# Patient Record
Sex: Female | Born: 1970 | Race: White | Hispanic: No | Marital: Married | Smoking: Former smoker
Health system: Southern US, Community
[De-identification: ages and names within clinical notes are randomized; demographics above are authoritative.]

## PROBLEM LIST (undated history)

## (undated) DIAGNOSIS — I10 Essential (primary) hypertension: Secondary | ICD-10-CM

## (undated) DIAGNOSIS — Z8742 Personal history of other diseases of the female genital tract: Secondary | ICD-10-CM

## (undated) DIAGNOSIS — F419 Anxiety disorder, unspecified: Secondary | ICD-10-CM

## (undated) HISTORY — DX: Essential (primary) hypertension: I10

## (undated) HISTORY — DX: Anxiety disorder, unspecified: F41.9

## (undated) HISTORY — DX: Personal history of other diseases of the female genital tract: Z87.42

---

## 1998-11-16 ENCOUNTER — Emergency Department (HOSPITAL_COMMUNITY): Admission: EM | Admit: 1998-11-16 | Discharge: 1998-11-16 | Payer: Self-pay | Admitting: Emergency Medicine

## 2001-12-28 ENCOUNTER — Other Ambulatory Visit: Admission: RE | Admit: 2001-12-28 | Discharge: 2001-12-28 | Payer: Self-pay | Admitting: Obstetrics and Gynecology

## 2002-09-14 ENCOUNTER — Encounter: Admission: RE | Admit: 2002-09-14 | Discharge: 2002-09-14 | Payer: Self-pay | Admitting: Family Medicine

## 2002-09-14 ENCOUNTER — Encounter: Payer: Self-pay | Admitting: Family Medicine

## 2007-07-07 ENCOUNTER — Ambulatory Visit: Payer: Self-pay | Admitting: Family Medicine

## 2010-02-02 ENCOUNTER — Ambulatory Visit (HOSPITAL_COMMUNITY): Admission: RE | Admit: 2010-02-02 | Discharge: 2010-02-02 | Payer: Self-pay | Admitting: Gastroenterology

## 2010-02-19 ENCOUNTER — Encounter: Admission: RE | Admit: 2010-02-19 | Discharge: 2010-02-19 | Payer: Self-pay | Admitting: *Deleted

## 2010-02-27 HISTORY — PX: CHOLECYSTECTOMY: SHX55

## 2010-09-29 ENCOUNTER — Ambulatory Visit: Payer: Self-pay | Admitting: Family Medicine

## 2010-09-29 DIAGNOSIS — R350 Frequency of micturition: Secondary | ICD-10-CM | POA: Insufficient documentation

## 2010-09-29 DIAGNOSIS — R5383 Other fatigue: Secondary | ICD-10-CM | POA: Insufficient documentation

## 2010-09-29 DIAGNOSIS — R5381 Other malaise: Secondary | ICD-10-CM

## 2010-09-29 LAB — CONVERTED CEMR LAB
ALT: 43 units/L — ABNORMAL HIGH (ref 0–35)
AST: 37 units/L (ref 0–37)
Albumin: 3.8 g/dL (ref 3.5–5.2)
Alkaline Phosphatase: 120 units/L — ABNORMAL HIGH (ref 39–117)
BUN: 14 mg/dL (ref 6–23)
Basophils Absolute: 0 10*3/uL (ref 0.0–0.1)
Basophils Relative: 0.5 % (ref 0.0–3.0)
Bilirubin, Direct: 0 mg/dL (ref 0.0–0.3)
CO2: 29 meq/L (ref 19–32)
Calcium: 9.2 mg/dL (ref 8.4–10.5)
Chloride: 106 meq/L (ref 96–112)
Creatinine, Ser: 0.9 mg/dL (ref 0.4–1.2)
Eosinophils Absolute: 0.1 10*3/uL (ref 0.0–0.7)
Eosinophils Relative: 2.1 % (ref 0.0–5.0)
Folate: 9.2 ng/mL
Free T4: 0.87 ng/dL (ref 0.60–1.60)
GFR calc non Af Amer: 77.63 mL/min (ref >60)
Glucose, Bld: 69 mg/dL — ABNORMAL LOW (ref 70–99)
HCT: 36 % (ref 36.0–46.0)
Hemoglobin: 12.4 g/dL (ref 12.0–15.0)
Hgb A1c MFr Bld: 5.6 % (ref 4.6–6.5)
Iron: 45 ug/dL (ref 42–145)
Lymphocytes Relative: 30.7 % (ref 12.0–46.0)
Lymphs Abs: 2 10*3/uL (ref 0.7–4.0)
MCHC: 34.5 g/dL (ref 30.0–36.0)
MCV: 83.8 fL (ref 78.0–100.0)
Monocytes Absolute: 0.4 10*3/uL (ref 0.1–1.0)
Monocytes Relative: 6.6 % (ref 3.0–12.0)
Neutro Abs: 4 10*3/uL (ref 1.4–7.7)
Neutrophils Relative %: 60.1 % (ref 43.0–77.0)
Platelets: 276 10*3/uL (ref 150.0–400.0)
Potassium: 4.2 meq/L (ref 3.5–5.1)
RBC: 4.29 M/uL (ref 3.87–5.11)
RDW: 13.6 % (ref 11.5–14.6)
Saturation Ratios: 13.7 % — ABNORMAL LOW (ref 20.0–50.0)
Sodium: 142 meq/L (ref 135–145)
T3, Free: 3.2 pg/mL (ref 2.3–4.2)
TSH: 2.39 microintl units/mL (ref 0.35–5.50)
Total Bilirubin: 0.5 mg/dL (ref 0.3–1.2)
Total Protein: 6.8 g/dL (ref 6.0–8.3)
Transferrin: 234.7 mg/dL (ref 212.0–360.0)
Vitamin B-12: 887 pg/mL (ref 211–911)
WBC: 6.6 10*3/uL (ref 4.5–10.5)

## 2010-10-06 ENCOUNTER — Ambulatory Visit: Payer: Self-pay | Admitting: Family Medicine

## 2010-10-28 ENCOUNTER — Ambulatory Visit: Payer: Self-pay | Admitting: Family Medicine

## 2010-12-19 ENCOUNTER — Encounter: Payer: Self-pay | Admitting: Family Medicine

## 2010-12-29 ENCOUNTER — Ambulatory Visit
Admission: RE | Admit: 2010-12-29 | Discharge: 2010-12-29 | Payer: Self-pay | Source: Home / Self Care | Attending: Family Medicine | Admitting: Family Medicine

## 2010-12-29 NOTE — Assessment & Plan Note (Signed)
Summary: sinus inf/sore throat/cough/chest congestion/cjr   Vital Signs:  Patient profile:   40 year old female Temp:     98.3 degrees F oral BP sitting:   140 / 86  (left arm) Cuff size:   regular  Vitals Entered By: Sid Falcon LPN (October 28, 2010 2:48 PM)  History of Present Illness: Patient seen with one day history of laryngitis symptoms. Has mild dry cough. Couple day history of postnasal drip symptoms. Clear nasal discharge. Has not tried any medications. Denies any fever. Nonsmoker. Mild malaise. No sore throat.  Allergies (verified): No Known Drug Allergies  Review of Systems  The patient denies fever, hoarseness, chest pain, dyspnea on exertion, prolonged cough, and hemoptysis.    Physical Exam  General:  Well-developed,well-nourished,in no acute distress; alert,appropriate and cooperative throughout examination Ears:  External ear exam shows no significant lesions or deformities.  Otoscopic examination reveals clear canals, tympanic membranes are intact bilaterally without bulging, retraction, inflammation or discharge. Hearing is grossly normal bilaterally. Nose:  External nasal examination shows no deformity or inflammation. Nasal mucosa are pink and moist without lesions or exudates. Mouth:  Oral mucosa and oropharynx without lesions or exudates.  Teeth in good repair. Neck:  No deformities, masses, or tenderness noted. Lungs:  Normal respiratory effort, chest expands symmetrically. Lungs are clear to auscultation, no crackles or wheezes. Heart:  Normal rate and regular rhythm. S1 and S2 normal without gallop, murmur, click, rub or other extra sounds.   Impression & Recommendations:  Problem # 1:  LARYNGITIS, ACUTE (ICD-464.00) differential is viral versus allergic postnasal drip. Samples of Nasonex 2 sprays per nostril once daily and try over-the-counter Allegra or Claritin  Complete Medication List: 1)  Lupron Depot 3.75 Mg Kit (Leuprolide acetate) .... As  directed  Patient Instructions: 1)  Nasonex 2 sprays per nostril once daily 2)  Try over-the-counter Claritin or Allegra   Orders Added: 1)  Est. Patient Level III [04540]

## 2010-12-29 NOTE — Assessment & Plan Note (Signed)
Summary: WEAKNESS, FATIGUE, H/A // RS   Vital Signs:  Patient profile:   40 year old female Height:      60 inches Weight:      194 pounds BMI:     38.02 Temp:     98.5 degrees F oral BP sitting:   130 / 90  (left arm) Cuff size:   regular  Vitals Entered By: Kern Reap CMA Duncan Dull) (September 29, 2010 3:59 PM)  History of Present Illness: [name] is a 40 year old female, married, nonsmoker, who comes in with a 6 week history of fatigue.  No energy.  Her weight is unchanged.  She may have gained a couple pounds.  Review of systems negative except for nocturia x 5, fatigue, and no energy.  No fever, earache, sore throat, cough, nausea, vomiting, diarrhea, etc., are she's currently on Lupron via GYN because of underlying endometriosis.  She does not smoke nor drink.  Family history pertinent mother is diabetic  Psychiatric review of systems negative  Preventive Screening-Counseling & Management  Alcohol-Tobacco     Smoking Status: quit  Hep-HIV-STD-Contraception     Dental Visit-last 6 months no      Drug Use:  no.    Allergies (verified): No Known Drug Allergies  Past History:  Past medical, surgical, family and social histories (including risk factors) reviewed, and no changes noted (except as noted below).  Past Surgical History: Cholecystectomy  Family History: Reviewed history and no changes required. Father: HTN, fibermyalgia, arthritis Mother: DM II Siblings: none  Social History: Reviewed history and no changes required. Occupation:teacher  Married Alcohol use-no Drug use-no Drug Use:  no Smoking Status:  quit Dental Care w/in 6 mos.:  no  Review of Systems      See HPI  Physical Exam  General:  Well-developed,well-nourished,in no acute distress; alert,appropriate and cooperative throughout examination Head:  Normocephalic and atraumatic without obvious abnormalities. No apparent alopecia or balding. Eyes:  No corneal or conjunctival  inflammation noted. EOMI. Perrla. Funduscopic exam benign, without hemorrhages, exudates or papilledema. Vision grossly normal. Ears:  External ear exam shows no significant lesions or deformities.  Otoscopic examination reveals clear canals, tympanic membranes are intact bilaterally without bulging, retraction, inflammation or discharge. Hearing is grossly normal bilaterally. Nose:  External nasal examination shows no deformity or inflammation. Nasal mucosa are pink and moist without lesions or exudates. Mouth:  Oral mucosa and oropharynx without lesions or exudates.  Teeth in good repair. Neck:  No deformities, masses, or tenderness noted. Chest Wall:  No deformities, masses, or tenderness noted. Lungs:  Normal respiratory effort, chest expands symmetrically. Lungs are clear to auscultation, no crackles or wheezes. Heart:  Normal rate and regular rhythm. S1 and S2 normal without gallop, murmur, click, rub or other extra sounds. Abdomen:  Bowel sounds positive,abdomen soft and non-tender without masses, organomegaly or hernias noted. Msk:  No deformity or scoliosis noted of thoracic or lumbar spine.   Pulses:  R and L carotid,radial,femoral,dorsalis pedis and posterior tibial pulses are full and equal bilaterally Extremities:  No clubbing, cyanosis, edema, or deformity noted with normal full range of motion of all joints.   Neurologic:  No cranial nerve deficits noted. Station and gait are normal. Plantar reflexes are down-going bilaterally. DTRs are symmetrical throughout. Sensory, motor and coordinative functions appear intact. Skin:  Intact without suspicious lesions or rashes Cervical Nodes:  No lymphadenopathy noted Axillary Nodes:  No palpable lymphadenopathy Inguinal Nodes:  No significant adenopathy Psych:  Cognition and judgment appear  intact. Alert and cooperative with normal attention span and concentration. No apparent delusions, illusions, hallucinations   Problems:  Medical  Problems Added: 1)  Dx of Frequency, Urinary  (ICD-788.41) 2)  Dx of Fatigue  (ICD-780.79)  Impression & Recommendations:  Problem # 1:  FREQUENCY, URINARY (ICD-788.41) Assessment New  Orders: Venipuncture (29528) TLB-BMP (Basic Metabolic Panel-BMET) (80048-METABOL) TLB-CBC Platelet - w/Differential (85025-CBCD) TLB-Hepatic/Liver Function Pnl (80076-HEPATIC) TLB-TSH (Thyroid Stimulating Hormone) (84443-TSH) TLB-B12 + Folate Pnl (41324_40102-V25/DGU) TLB-IBC Pnl (Iron/FE;Transferrin) (83550-IBC) TLB-T3, Free (Triiodothyronine) (84481-T3FREE) TLB-T4 (Thyrox), Free (84439-FT4R) TLB-A1C / Hgb A1C (Glycohemoglobin) (83036-A1C) UA Dipstick w/o Micro (automated)  (81003) Specimen Handling (44034)  Problem # 2:  FATIGUE (ICD-780.79) Assessment: New  Orders: Venipuncture (74259) TLB-BMP (Basic Metabolic Panel-BMET) (80048-METABOL) TLB-CBC Platelet - w/Differential (85025-CBCD) TLB-Hepatic/Liver Function Pnl (80076-HEPATIC) TLB-TSH (Thyroid Stimulating Hormone) (84443-TSH) TLB-B12 + Folate Pnl (56387_56433-I95/JOA) TLB-IBC Pnl (Iron/FE;Transferrin) (83550-IBC) TLB-T3, Free (Triiodothyronine) (84481-T3FREE) TLB-T4 (Thyrox), Free (84439-FT4R) TLB-A1C / Hgb A1C (Glycohemoglobin) (83036-A1C) UA Dipstick w/o Micro (automated)  (81003) Specimen Handling (41660)  Complete Medication List: 1)  Lupron Depot 3.75 Mg Kit (Leuprolide acetate) .... As directed  Patient Instructions: 1)  I will call you when I get your lab work back to discuss the various options   Orders Added: 1)  Venipuncture [36415] 2)  TLB-BMP (Basic Metabolic Panel-BMET) [80048-METABOL] 3)  TLB-CBC Platelet - w/Differential [85025-CBCD] 4)  TLB-Hepatic/Liver Function Pnl [80076-HEPATIC] 5)  TLB-TSH (Thyroid Stimulating Hormone) [84443-TSH] 6)  TLB-B12 + Folate Pnl [82746_82607-B12/FOL] 7)  TLB-IBC Pnl (Iron/FE;Transferrin) [83550-IBC] 8)  TLB-T3, Free (Triiodothyronine) [84481-T3FREE] 9)  TLB-T4 (Thyrox), Free  [63016-WF0X] 10)  TLB-A1C / Hgb A1C (Glycohemoglobin) [83036-A1C] 11)  Est. Patient Level IV [32355] 12)  UA Dipstick w/o Micro (automated)  [81003] 13)  Specimen Handling [99000]

## 2010-12-29 NOTE — Assessment & Plan Note (Signed)
Summary: follow up visit - rv lrsc per doc/njr 445p   Vital Signs:  Patient profile:   40 year old female Weight:      191 pounds Temp:     98.1 degrees F oral BP sitting:   154 / 90  (right arm) Cuff size:   regular  Vitals Entered By: Kern Reap CMA Duncan Dull) (October 06, 2010 4:20 PM) CC: follow-up visit   CC:  follow-up visit.  History of Present Illness: [name] is a 40 year old female, who comes in today for reevaluation of fatigue.  We did a complete exam and lab studies.  In reviewing her laboratory data with her everything is normal.  I think the fatigue is probably generated from the Lupron.  She will discuss with her GYN either continue the Lupron or switching to continue his BCPs.  She is also started on an exercise program and has lost 3 pounds.  Encouraged to continue the process  Allergies: No Known Drug Allergies  Past History:  Past medical, surgical, family and social histories (including risk factors) reviewed for relevance to current acute and chronic problems.  Past Surgical History: Reviewed history from 09/29/2010 and no changes required. Cholecystectomy  Family History: Reviewed history from 09/29/2010 and no changes required. Father: HTN, fibermyalgia, arthritis Mother: DM II Siblings: none  Social History: Reviewed history from 09/29/2010 and no changes required. Occupation:teacher  Married Alcohol use-no Drug use-no  Review of Systems      See HPI  Physical Exam  General:  Well-developed,well-nourished,in no acute distress; alert,appropriate and cooperative throughout examination   Impression & Recommendations:  Problem # 1:  FATIGUE (ICD-780.79) Assessment Unchanged  Complete Medication List: 1)  Lupron Depot 3.75 Mg Kit (Leuprolide acetate) .... As directed  Patient Instructions: 1)  [name] GYN because I think the fatigue is a medication related phenomenon.   Orders Added: 1)  Est. Patient Level III [16109]

## 2010-12-29 NOTE — Assessment & Plan Note (Signed)
Summary: roa needs inj for tb/hep A per angela//jnl    Chief Complaint:  update on shots for mission trip.           Patient Instructions: 1)  immunizations were given as outlined by the nurse.  There is no charge for the office visit since it was shots only.          Hepatitis B Vaccine # 1    Vaccine Type: HepB Adult    Site: left deltoid    Mfr: GlaxoSmithKline    Dose: 1.0 ml    Route: IM    Given by: Arcola Jansky, RN    Exp. Date: 04/28/2009    Lot #: [id number]    VIS given: 06/08/00 version given July 07, 2007.  Tetanus/Td Vaccine    Vaccine Type: Tdap    Site: right deltoid    Mfr: sanofi pasteur    Dose: 0.5 ml    Route: IM    Given by: Arcola Jansky, RN    Exp. Date: 09/15/2009    Lot #: [id number]    VIS given: 06/09/05 version given July 07, 2007.  Hepatitis A Vaccine # 1    Vaccine Type: HepA    Site: right deltoid    Mfr: GlaxoSmithKline    Dose: 0.1 ml    Route: IM    Given by: Arcola Jansky, RN    Exp. Date: 08/02/2009    Lot #: [id number]    VIS given: 02/16/05 version given July 07, 2007.  PPD Application    Vaccine Type: PPD    Site: left forearm    Mfr: sanofi  Pasteur    Dose: 0.1 ml    Route: ID    Given by: Arcola Jansky, RN    Exp. Date: 05/20/2009    Lot #: [id number]

## 2010-12-31 ENCOUNTER — Ambulatory Visit (INDEPENDENT_AMBULATORY_CARE_PROVIDER_SITE_OTHER): Payer: Self-pay | Admitting: Family Medicine

## 2010-12-31 DIAGNOSIS — Z0000 Encounter for general adult medical examination without abnormal findings: Secondary | ICD-10-CM

## 2011-01-06 NOTE — Assessment & Plan Note (Signed)
Summary: TB TEST/FORM COMPLETION/NJR   Vital Signs:  Patient profile:   40 year old female Weight:      198 pounds Temp:     98.4 degrees F oral BP sitting:   130 / 94  (left arm) Cuff size:   regular  Vitals Entered By: Kern Reap CMA Duncan Dull) (December 29, 2010 12:50 PM) CC: forms and ppd   CC:  forms and ppd.  Allergies: No Known Drug Allergies   Complete Medication List: 1)  Lupron Depot 3.75 Mg Kit (Leuprolide acetate) .... As directed  Other Orders: TB Skin Test [phone]) Admin 1st Vaccine (69629) No Charge Patient Arrived (NCPA0) (NCPA0)   Orders Added: 1)  TB Skin Test [86580] 2)  Admin 1st Vaccine [90471] 3)  No Charge Patient Arrived (NCPA0) [NCPA0]   Immunizations Administered:  PPD Skin Test:    Vaccine Type: PPD    Site: left forearm    Mfr: Sanofi Pasteur    Dose: 0.1 ml    Route: ID    Given by: Kern Reap CMA (AAMA)    Exp. Date: 10/01/2011    Lot #: [id number]    Physician counseled: yes   Immunizations Administered:  PPD Skin Test:    Vaccine Type: PPD    Site: left forearm    Mfr: Sanofi Pasteur    Dose: 0.1 ml    Route: ID    Given by: Kern Reap CMA (AAMA)    Exp. Date: 10/01/2011    Lot #: [id number]    Physician counseled: yes

## 2011-01-06 NOTE — Letter (Signed)
Summary: Staff Medical Report  Staff Medical Report   Imported By: Maryln Gottron 01/01/2011 13:01:51  _____________________________________________________________________  External Attachment:    Type:   Image     Comment:   External Document

## 2011-01-18 NOTE — Progress Notes (Signed)
  Subjective:    Patient ID: [name], female    DOB: [birth year], 40 y.o.   MRN: [id number]  HPI    Review of Systems     Objective:   Physical Exam        Assessment & Plan:

## 2011-01-22 ENCOUNTER — Ambulatory Visit (INDEPENDENT_AMBULATORY_CARE_PROVIDER_SITE_OTHER): Payer: BC Managed Care – PPO | Admitting: Family Medicine

## 2011-01-22 ENCOUNTER — Encounter: Payer: Self-pay | Admitting: Family Medicine

## 2011-01-22 VITALS — BP 140/90 | HR 107 | Temp 99.5°F

## 2011-01-22 DIAGNOSIS — J029 Acute pharyngitis, unspecified: Secondary | ICD-10-CM

## 2011-01-22 MED ORDER — CEPHALEXIN 500 MG PO CAPS: 500.0000 mg | ORAL_CAPSULE | Freq: Three times a day (TID) | ORAL | Status: AC

## 2011-01-22 NOTE — Progress Notes (Signed)
  Subjective:    Patient ID: [name], female    DOB: Mar 06, 1971, 40 y.o.   MRN: [id number]  HPI Here for 3 days of HA. Fever, dry cough, nausea without vomitting, mild abdominal cramps,and a bad ST. Taking Motrin and drinking fluids. Her 59 yr old son was diagnosed with a strep throat 2 days ago.    Review of Systems  Constitutional: Positive for fever.  HENT: Negative for ear pain, congestion and sinus pressure.   Eyes: Negative.   Respiratory: Positive for cough. Negative for chest tightness, shortness of breath and wheezing.   Gastrointestinal: Positive for nausea and abdominal pain. Negative for diarrhea.       Objective:   Physical Exam  Constitutional: She appears well-developed and well-nourished. No distress.  HENT:  Head: Normocephalic and atraumatic.  Right Ear: External ear normal.  Left Ear: External ear normal.  Nose: Nose normal.  Mouth/Throat: No oropharyngeal exudate.       Posterior OP is red   Eyes: Conjunctivae and EOM are normal. Pupils are equal, round, and reactive to light.  Pulmonary/Chest: Effort normal and breath sounds normal. No respiratory distress. She has no wheezes. She has no rales. She exhibits no tenderness.  Abdominal: Bowel sounds are normal. She exhibits no distension. There is no tenderness.  Lymphadenopathy:    She has no cervical adenopathy.          Assessment & Plan:  Thi is consistent with a strep infection

## 2011-02-18 ENCOUNTER — Telehealth: Payer: Self-pay | Admitting: *Deleted

## 2011-02-18 NOTE — Telephone Encounter (Signed)
Pt. Is having headaches and blurred vision.  Prefers to go to ER rather than wait until we have an appt.

## 2011-04-28 ENCOUNTER — Other Ambulatory Visit: Payer: Self-pay | Admitting: Obstetrics and Gynecology

## 2011-04-28 ENCOUNTER — Encounter (HOSPITAL_COMMUNITY): Payer: 59

## 2011-04-28 LAB — CBC
HCT: 36.6 % (ref 36.0–46.0)
Platelets: 247 10*3/uL (ref 150–400)
RBC: 4.43 MIL/uL (ref 3.87–5.11)
RDW: 12.7 % (ref 11.5–15.5)
WBC: 7.9 10*3/uL (ref 4.0–10.5)

## 2011-04-28 LAB — SURGICAL PCR SCREEN
MRSA, PCR: NEGATIVE
Staphylococcus aureus: POSITIVE — AB

## 2011-05-05 ENCOUNTER — Ambulatory Visit (HOSPITAL_COMMUNITY)
Admission: RE | Admit: 2011-05-05 | Discharge: 2011-05-06 | Disposition: A | Discharge status: Home / Self Care | Payer: 59 | Source: Ambulatory Visit | Attending: Obstetrics and Gynecology | Admitting: Obstetrics and Gynecology

## 2011-05-05 ENCOUNTER — Other Ambulatory Visit: Payer: Self-pay | Admitting: Obstetrics and Gynecology

## 2011-05-05 DIAGNOSIS — Z01812 Encounter for preprocedural laboratory examination: Secondary | ICD-10-CM | POA: Insufficient documentation

## 2011-05-05 DIAGNOSIS — D252 Subserosal leiomyoma of uterus: Secondary | ICD-10-CM | POA: Insufficient documentation

## 2011-05-05 DIAGNOSIS — Z01818 Encounter for other preprocedural examination: Secondary | ICD-10-CM | POA: Insufficient documentation

## 2011-05-05 DIAGNOSIS — N946 Dysmenorrhea, unspecified: Secondary | ICD-10-CM | POA: Insufficient documentation

## 2011-05-05 DIAGNOSIS — N920 Excessive and frequent menstruation with regular cycle: Secondary | ICD-10-CM | POA: Insufficient documentation

## 2011-05-05 DIAGNOSIS — D251 Intramural leiomyoma of uterus: Secondary | ICD-10-CM | POA: Insufficient documentation

## 2011-05-05 DIAGNOSIS — N815 Vaginal enterocele: Secondary | ICD-10-CM | POA: Insufficient documentation

## 2011-05-05 HISTORY — PX: ABDOMINAL HYSTERECTOMY: SHX81

## 2011-05-05 LAB — HCG, SERUM, QUALITATIVE: Preg, Serum: NEGATIVE

## 2011-05-05 MED ORDER — RHO D IMMUNE GLOBULIN 1500 UNIT/2ML IJ SOLN: 300.0000 ug | Freq: Once | INTRAMUSCULAR | Status: DC

## 2011-05-05 MED ORDER — FENTANYL 10 MCG/ML IV SOLN: INTRAVENOUS | Status: DC | PRN

## 2011-05-05 MED ORDER — ACETAMINOPHEN 325 MG PO TABS: 325.0000 mg | ORAL_TABLET | ORAL | Status: DC | PRN

## 2011-05-05 MED ORDER — CLONIDINE HCL 0.1 MG PO TABS: 0.1000 mg | ORAL_TABLET | ORAL | Status: DC | PRN

## 2011-05-06 LAB — CBC
Hemoglobin: 10.8 g/dL — ABNORMAL LOW (ref 12.0–15.0)
MCH: 28.2 pg (ref 26.0–34.0)
Platelets: 237 10*3/uL (ref 150–400)
RBC: 3.83 MIL/uL — ABNORMAL LOW (ref 3.87–5.11)
WBC: 13.5 10*3/uL — ABNORMAL HIGH (ref 4.0–10.5)

## 2011-06-04 NOTE — Op Note (Signed)
[name], [name]           ACCOUNT NO.:  [id number]  MEDICAL RECORD NO.:  [id number]  LOCATION:  9318                          FACILITY:  [facility]  PHYSICIAN:  Lenoard Aden, M.D.DATE OF BIRTH:  [birth year]  DATE OF PROCEDURE:  05/05/2011 DATE OF DISCHARGE:                              OPERATIVE REPORT   PREOPERATIVE DIAGNOSES:  Symptomatic dysmenorrhea and menorrhagia with a history of endometriosis, responsive to medical therapy, now for definitive therapy.  POSTOPERATIVE DIAGNOSES:  Symptomatic dysmenorrhea and menorrhagia with a history of endometriosis, responsive to medical therapy, now for definitive therapy, symptomatic fibroids and enterocele.  PROCEDURES:  Da Vinci-assisted total laparoscopic hysterectomy, Rogelio Seen culdoplasty.  SURGEON:  Lenoard Aden, MD  ASSISTANT:  Fredric Mare.  ANESTHESIA:  General.  ESTIMATED BLOOD LOSS:  Less than 100 mL.  COMPLICATIONS:  None.  DRAINS:  Foley.  COUNTS:  Correct.  The patient recovered in good condition.  FINDINGS:  Multiple fibroids, otherwise normal uterus, normal tubes, ovaries and a small amount of endometriosis along the posterior uterosacral ligaments.  BRIEF OPERATIVE NOTE:  After being apprised of the risks of anesthesia, infection, bleeding, injury to abdominal organs, need for repair delayed versus immediate complications to include bowel and bladder injury, possible need for repair, the patient was brought to the operating room, she was administered general anesthetic without complications.  She was prepped and draped in sterile fashion.  Foley catheter placed.  Feet were placed in Yellofin stirrups.  A Rumi retractor was placed per vagina in a standard fashion.  Then, attention was turned to the abdominal portion of procedure whereby an infraumbilical incision was made with a scalpel.  Veress needle was placed with opening pressure of -2; 4.2 L of CO2 insufflated without difficulty.  Trocar  placed. Visualization revealed small amount of omental adhesions cephalad to the umbilicus from previous gallbladder surgery.  No evidence of bowel.  No evidence of bowel perforation.  At this time, the accessory sites were marked to 1 accessory port on the right and 2 on the left in the standard fashion.  Three 8-mm trocar sites were placed under direct visualization and robot is docked after achieving deep Trendelenburg position.  Visualization as noted in the operative findings of the case. At this time, the PK was entered through the left port and the EndoShears to the right port arm #3 and attention was turned to the case whereby the ovaries bilaterally appear normal.  Thereby the tubo-ovarian round ligament complexes were clamped bilaterally and ligated and divided using the PK and the EndoShears.  The broad ligament was divided.  The posterior and anterior sheath were developed skeletonizing the uterine vessels bilaterally.  The bladder flap was then developed sharply and palpably the cup was noted after good progression of thebladder flap was achieved.  At this time after achieving skeletonization of bilateral uterine vessels, the ureters having been identified to be well out of the way and peristalsing bilaterally.  The uterine vessels were sealed on the left and then sealed on the right.  After being sealed on the right, they were cut bilaterally and good hemostasis was noted.  The Rumi cup was then scored in 360-degree fashion at cephalad portion of the  cup detaching the specimen which was then retracted in the vagina without difficulty.  Good hemostasis was achieved at the cuff and interrupted sutures of 0 Vicryl on a CT-2 were replaced.  Multiple figure-of-eight, McCall culdoplasty sutures placed.  After closure of the vaginal cuff, good hemostasis was noted.  Irrigation was accomplished.  The patient tolerated the procedure well.  At this junction, the robot was then undocked  and all ports were removed under direct visualization.  CO2 was released.  Specimen was removed from the vagina.  At this time suture ports were closed using 0 Vicryl and 4-0 Vicryl.  Dermabond was placed.  The patient tolerated the procedure well, was awakened and transferred to recovery in good condition.     Lenoard Aden, M.D.     RJT/MEDQ  D:  05/05/2011  T:  05/06/2011  Job:  [id number]  Electronically Signed by Olivia Mackie M.D. on 06/04/2011 07:32:14 AM

## 2011-06-04 NOTE — H&P (Signed)
  NAMEROSELL, KHOURI NO.:  [id number]  MEDICAL RECORD NO.:  [id number]  LOCATION:                                 FACILITY:  PHYSICIAN:  Lenoard Aden, M.D.DATE OF BIRTH:  03-16-1971  DATE OF ADMISSION:  05/05/2011 DATE OF DISCHARGE:                             HISTORY & PHYSICAL   CHIEF COMPLAINT:  Pelvic pain, dysmenorrhea, menorrhagia with a history of endometriosis diagnosed by laparoscopy, now for definitive therapy.  She is a 40 year old white female, G1, P0 with a history of SAB in 1999, with a history of previously diagnosed endometriosis at the time of cholecystectomy, who is now status post long-term treatment with Lupron with marked improvement in her pain and discomfort, who is now after a year of Lupron and now for definitive therapy.  She desires to proceed with hysterectomy at this time with probable ovarian conservation.  She has no known drug allergies.  She has a family history of hypertension, breast cancer, MI, and diabetes.  She is a nonsmoker, nondrinker.  She denies domestic or physical violence.  SURGICAL HISTORY:  Remarkable for cholecystectomy and miscarriage as noted.  Surgical history otherwise noncontributory.  PHYSICAL EXAMINATION:  GENERAL:  She is a well-developed, well-nourished white female in no acute distress. VITAL SIGNS:  Blood pressure 140/88, height of 5 feet and 1-1/2 inch, and weight of 194 pounds. HEENT:  Normal. NECK:  Supple.  Full range of motion. LUNGS:  Clear to auscultation. ABDOMEN:  Soft, nontender. PELVIC:  An anteflexed uterus, not enlarged.  No adnexal masses. EXTREMITIES:  No cords. NEUROLOGIC:  Nonfocal. SKIN:  Intact.  IMPRESSION:  History of symptomatic endometriosis with no response to medical therapy now for definitive surgical therapy.  PLAN:  Proceed with da Vinci-assisted total laparoscopic hysterectomy, possible BSO.  Risks of anesthesia, infection, bleeding, injury  to abdominal organs, need for repair are discussed.  Delayed versus immediate complications include bowel and bladder injury noted, inability or long-term pelvic pain were discussed.  The patient acknowledges and wishes to proceed with definitive therapy.     Lenoard Aden, M.D.     RJT/MEDQ  D:  05/04/2011  T:  05/04/2011  Job:  [id number]  Electronically Signed by Olivia Mackie M.D. on 06/04/2011 07:32:11 AM

## 2011-09-17 ENCOUNTER — Encounter: Payer: Self-pay | Admitting: Family Medicine

## 2011-09-17 ENCOUNTER — Ambulatory Visit (INDEPENDENT_AMBULATORY_CARE_PROVIDER_SITE_OTHER): Payer: 59 | Admitting: Family Medicine

## 2011-09-17 VITALS — BP 130/84 | HR 112 | Temp 98.4°F | Wt 183.0 lb

## 2011-09-17 DIAGNOSIS — J40 Bronchitis, not specified as acute or chronic: Secondary | ICD-10-CM

## 2011-09-17 MED ORDER — AZITHROMYCIN 250 MG PO TABS: ORAL_TABLET | ORAL | Status: AC

## 2011-09-17 NOTE — Progress Notes (Signed)
  Subjective:    Patient ID: [name], female    DOB: 09/18/1971, 40 y.o.   MRN: [id number]  HPI Here for one week of stuffy head, PND, ST, chest congestion, and coughing up green sputum. No fever.    Review of Systems  Constitutional: Negative.   HENT: Positive for congestion, postnasal drip and sinus pressure.   Eyes: Negative.   Respiratory: Positive for cough.        Objective:   Physical Exam  Constitutional: She appears well-developed and well-nourished.  HENT:  Right Ear: External ear normal.  Left Ear: External ear normal.  Nose: Nose normal.  Mouth/Throat: Oropharynx is clear and moist. No oropharyngeal exudate.  Eyes: Conjunctivae are normal. Pupils are equal, round, and reactive to light.  Neck: No thyromegaly present.  Pulmonary/Chest: Effort normal and breath sounds normal. No respiratory distress. She has no wheezes. She has no rales. She exhibits no tenderness.  Lymphadenopathy:    She has no cervical adenopathy.          Assessment & Plan:  Add Delsym prn

## 2012-05-03 ENCOUNTER — Other Ambulatory Visit (HOSPITAL_BASED_OUTPATIENT_CLINIC_OR_DEPARTMENT_OTHER): Payer: Self-pay | Admitting: Obstetrics and Gynecology

## 2012-05-03 DIAGNOSIS — Z1231 Encounter for screening mammogram for malignant neoplasm of breast: Secondary | ICD-10-CM

## 2012-05-08 ENCOUNTER — Ambulatory Visit (HOSPITAL_BASED_OUTPATIENT_CLINIC_OR_DEPARTMENT_OTHER)
Admission: RE | Admit: 2012-05-08 | Discharge: 2012-05-08 | Disposition: A | Discharge status: Home / Self Care | Payer: 59 | Source: Ambulatory Visit | Attending: Obstetrics and Gynecology | Admitting: Obstetrics and Gynecology

## 2012-05-08 ENCOUNTER — Ambulatory Visit (HOSPITAL_BASED_OUTPATIENT_CLINIC_OR_DEPARTMENT_OTHER): Payer: 59

## 2012-05-08 DIAGNOSIS — Z1231 Encounter for screening mammogram for malignant neoplasm of breast: Secondary | ICD-10-CM | POA: Insufficient documentation

## 2012-08-31 ENCOUNTER — Ambulatory Visit (INDEPENDENT_AMBULATORY_CARE_PROVIDER_SITE_OTHER): Payer: 59 | Admitting: Family Medicine

## 2012-08-31 ENCOUNTER — Encounter: Payer: Self-pay | Admitting: Family Medicine

## 2012-08-31 ENCOUNTER — Ambulatory Visit
Admission: RE | Admit: 2012-08-31 | Discharge: 2012-08-31 | Disposition: A | Discharge status: Home / Self Care | Payer: 59 | Source: Ambulatory Visit | Attending: Family Medicine | Admitting: Family Medicine

## 2012-08-31 VITALS — BP 150/100 | HR 92 | Resp 16 | Wt 183.0 lb

## 2012-08-31 DIAGNOSIS — R51 Headache: Secondary | ICD-10-CM

## 2012-08-31 DIAGNOSIS — R42 Dizziness and giddiness: Secondary | ICD-10-CM

## 2012-08-31 DIAGNOSIS — I10 Essential (primary) hypertension: Secondary | ICD-10-CM

## 2012-08-31 DIAGNOSIS — E162 Hypoglycemia, unspecified: Secondary | ICD-10-CM

## 2012-08-31 LAB — CBC WITH DIFFERENTIAL/PLATELET
Basophils Absolute: 0 10*3/uL (ref 0.0–0.1)
Basophils Relative: 0.5 % (ref 0.0–3.0)
Eosinophils Absolute: 0.2 10*3/uL (ref 0.0–0.7)
Eosinophils Relative: 1.9 % (ref 0.0–5.0)
HCT: 40.5 % (ref 36.0–46.0)
Hemoglobin: 13.4 g/dL (ref 12.0–15.0)
Lymphocytes Relative: 21.4 % (ref 12.0–46.0)
MCHC: 33.1 g/dL (ref 30.0–36.0)
MCV: 86.5 fl (ref 78.0–100.0)
Monocytes Relative: 4.5 % (ref 3.0–12.0)
Neutro Abs: 6 10*3/uL (ref 1.4–7.7)
Neutrophils Relative %: 71.7 % (ref 43.0–77.0)
Platelets: 321 10*3/uL (ref 150.0–400.0)
RBC: 4.68 Mil/uL (ref 3.87–5.11)
WBC: 8.3 10*3/uL (ref 4.5–10.5)

## 2012-08-31 LAB — LIPID PANEL
Cholesterol: 146 mg/dL (ref 0–200)
HDL: 51.2 mg/dL (ref >39.00)
LDL Cholesterol: 76 mg/dL (ref 0–99)
Total CHOL/HDL Ratio: 3
Triglycerides: 94 mg/dL (ref 0.0–149.0)
VLDL: 18.8 mg/dL (ref 0.0–40.0)

## 2012-08-31 LAB — HEPATIC FUNCTION PANEL
ALT: 37 U/L — ABNORMAL HIGH (ref 0–35)
AST: 35 U/L (ref 0–37)
Albumin: 4 g/dL (ref 3.5–5.2)
Alkaline Phosphatase: 112 U/L (ref 39–117)
Bilirubin, Direct: 0.1 mg/dL (ref 0.0–0.3)
Total Bilirubin: 0.5 mg/dL (ref 0.3–1.2)
Total Protein: 7.5 g/dL (ref 6.0–8.3)

## 2012-08-31 LAB — GLUCOSE, POCT (MANUAL RESULT ENTRY): POC Glucose: 118 mg/dl — AB (ref 70–99)

## 2012-08-31 LAB — MICROALBUMIN / CREATININE URINE RATIO
Microalb Creat Ratio: 2.6 mg/g (ref 0.0–30.0)
Microalb, Ur: 2.8 mg/dL — ABNORMAL HIGH (ref 0.0–1.9)

## 2012-08-31 LAB — BASIC METABOLIC PANEL
BUN: 9 mg/dL (ref 6–23)
CO2: 27 mEq/L (ref 19–32)
Calcium: 9 mg/dL (ref 8.4–10.5)
Chloride: 105 mEq/L (ref 96–112)
Glucose, Bld: 87 mg/dL (ref 70–99)
Sodium: 139 mEq/L (ref 135–145)

## 2012-08-31 LAB — VITAMIN B12: Vitamin B-12: 615 pg/mL (ref 211–911)

## 2012-08-31 LAB — TSH: TSH: 2.06 u[IU]/mL (ref 0.35–5.50)

## 2012-08-31 MED ORDER — ALPRAZOLAM 0.5 MG PO TABS: ORAL_TABLET | ORAL | Status: DC

## 2012-08-31 MED ORDER — LISINOPRIL-HYDROCHLOROTHIAZIDE 10-12.5 MG PO TABS: 1.0000 | ORAL_TABLET | Freq: Every day | ORAL | Status: DC

## 2012-08-31 NOTE — Progress Notes (Signed)
  Subjective:    [name] is a 41 y.o. female who presents for evaluation of elevated blood pressures. Age at onset of elevated blood pressure:  41. Cardiac symptoms: fatigue and headache, dizziness. Patient denies: chest pain, chest pressure/discomfort, dyspnea, exertional chest pressure/discomfort, irregular heart beat, lower extremity edema, near-syncope, orthopnea, palpitations, paroxysmal nocturnal dyspnea, syncope and tachypnea. Cardiovascular risk factors: obesity (BMI >= 30 kg/m2) and sedentary lifestyle. Use of agents associated with hypertension: none. History of target organ damage: none.  Pt states she had sudden onset of headache this am with dizziness, nausea and she felt liket she was going to pass out.  Her husband picked her up at work and brought her here.  She is new to [place].   The following portions of the patient's history were reviewed and updated as appropriate: allergies, current medications, past family history, past medical history, past social history, past surgical history and problem list.  Review of Systems Pertinent items are noted in HPI.   Objective:    BP 150/100  Pulse 92  Resp 16  Wt 183 lb (83.008 kg)  SpO2 96% General appearance: alert, cooperative, appears stated age and moderate distress Eyes: negative findings: lids and lashes normal, conjunctivae and sclerae normal and pupils equal, round, reactive to light and accomodation Ears: normal TM's and external ear canals both ears Nose: Nares normal. Septum midline. Mucosa normal. No drainage or sinus tenderness. Throat: lips, mucosa, and tongue normal; teeth and gums normal Neck: no adenopathy, supple, symmetrical, trachea midline and thyroid not enlarged, symmetric, no tenderness/mass/nodules Lungs: clear to auscultation bilaterally Heart: S1, S2 normal Extremities: extremities normal, atraumatic, no cyanosis or edema Neurologic: Mental status: Alert, oriented, thought content  appropriate Cranial nerves: normal Sensory: normal Motor: 4/5 strength LLE--but pt states it is not new Coordination: dizziness witn walking--holding on to her husband Gait: Abnormal---just very dizzy  Cardiographics ECG: normal sinus rhythm    Assessment:    Hypertension, stage 2 . Evidence of target organ damage: none.   headache-- sudden onset Dizzy  Plan:    Medication: begin lisinopril hct. Screening labs for initial evaluation: basic metabolic panel, blood sugar, calcium, creatinine, hematocrit, lipid panel, potassium and urinalysis. Dietary sodium restriction. Regular aerobic exercise. Check blood pressures 1 times daily and record. Follow up: 2 weeks and as needed.

## 2012-08-31 NOTE — Patient Instructions (Signed)

## 2012-09-22 ENCOUNTER — Ambulatory Visit: Payer: 59 | Admitting: Family Medicine

## 2012-09-28 ENCOUNTER — Encounter: Payer: Self-pay | Admitting: Family Medicine

## 2012-09-28 ENCOUNTER — Ambulatory Visit (INDEPENDENT_AMBULATORY_CARE_PROVIDER_SITE_OTHER): Payer: 59 | Admitting: Family Medicine

## 2012-09-28 VITALS — BP 136/78 | HR 88 | Temp 99.1°F | Wt 197.4 lb

## 2012-09-28 DIAGNOSIS — I10 Essential (primary) hypertension: Secondary | ICD-10-CM

## 2012-09-28 NOTE — Progress Notes (Signed)
  Subjective:    Patient here for follow-up of elevated blood pressure.  She is not exercising and is adherent to a low-salt diet.  Blood pressure is not checking at home. Cardiac symptoms: none. Patient denies: chest pain, chest pressure/discomfort, claudication, dyspnea, exertional chest pressure/discomfort, irregular heart beat, lower extremity edema, near-syncope, orthopnea, palpitations, paroxysmal nocturnal dyspnea, syncope and tachypnea. Cardiovascular risk factors: hypertension, obesity (BMI >= 30 kg/m2) and sedentary lifestyle. Use of agents associated with hypertension: none. History of target organ damage: none.  The following portions of the patient's history were reviewed and updated as appropriate: allergies, current medications, past family history, past medical history, past social history, past surgical history and problem list.  Review of Systems Pertinent items are noted in HPI.     Objective:    BP 136/78  Pulse 88  Temp 99.1 F (37.3 C) (Oral)  Wt 197 lb 6.4 oz (89.54 kg)  SpO2 98% General appearance: alert, cooperative, appears stated age and no distress Neck: no adenopathy, supple, symmetrical, trachea midline and thyroid not enlarged, symmetric, no tenderness/mass/nodules Lungs: clear to auscultation bilaterally Heart: S1, S2 normal Extremities: extremities normal, atraumatic, no cyanosis or edema    Assessment:    Hypertension, normal blood pressure . Evidence of target organ damage: none.    Plan:    Medication: no change. Dietary sodium restriction. Regular aerobic exercise. Check blood pressures 2-3 times weekly and record. Follow up: 3 months and as needed.

## 2012-09-28 NOTE — Patient Instructions (Signed)

## 2012-10-20 ENCOUNTER — Ambulatory Visit (INDEPENDENT_AMBULATORY_CARE_PROVIDER_SITE_OTHER): Payer: 59 | Admitting: Family

## 2012-10-20 ENCOUNTER — Encounter: Payer: Self-pay | Admitting: Family

## 2012-10-20 VITALS — BP 100/70 | HR 75 | Temp 97.9°F | Resp 16 | Wt 193.1 lb

## 2012-10-20 DIAGNOSIS — J029 Acute pharyngitis, unspecified: Secondary | ICD-10-CM

## 2012-10-20 DIAGNOSIS — J069 Acute upper respiratory infection, unspecified: Secondary | ICD-10-CM

## 2012-10-20 LAB — POCT RAPID STREP A (OFFICE): Rapid Strep A Screen: NEGATIVE

## 2012-10-20 NOTE — Patient Instructions (Addendum)
Viral Infections  A virus is a type of germ. Viruses can cause:   Minor sore throats.   Aches and pains.   Headaches.   Runny nose.   Rashes.   Watery eyes.   Tiredness.   Coughs.   Loss of appetite.   Feeling sick to your stomach (nausea).   Throwing up (vomiting).   Watery poop (diarrhea).  HOME CARE    Only take medicines as told by your doctor.   Drink enough water and fluids to keep your pee (urine) clear or pale yellow. Sports drinks are a good choice.   Get plenty of rest and eat healthy. Soups and broths with crackers or rice are fine.  GET HELP RIGHT AWAY IF:    You have a very bad headache.   You have shortness of breath.   You have chest pain or neck pain.   You have an unusual rash.   You cannot stop throwing up.   You have watery poop that does not stop.   You cannot keep fluids down.   You or your child has a temperature by mouth above 102 F (38.9 C), not controlled by medicine.   Your baby is older than 3 months with a rectal temperature of 102 F (38.9 C) or higher.   Your baby is 3 months old or younger with a rectal temperature of 100.4 F (38 C) or higher.  MAKE SURE YOU:    Understand these instructions.   Will watch this condition.   Will get help right away if you are not doing well or get worse.  Document Released: 10/28/2008 Document Revised: 02/07/2012 Document Reviewed: 03/23/2011  ExitCare Patient Information 2013 [employer].

## 2012-10-20 NOTE — Progress Notes (Signed)
  Subjective:    Patient ID: [name], female    DOB: Apr 24, 1971, 41 y.o.   MRN: [id number]  HPI  [name] is a 41 yr old female who presents today with chief complaint of sore throat.  She reports that her sore throat is currently 6-7/10.  Has associated poor appetite, nausea.  Denies associated fever.  She has used tylenol with some improvement.   Review of Systems See HPI  Past Medical History  Diagnosis Date  . H/O endometritis     History   Social History  . Marital Status: Married    Spouse Name: N/A    Number of Children: N/A  . Years of Education: N/A   Occupational History  . teacher     [employer]   Social History Main Topics  . Smoking status: Former Smoker    Quit date: 11/29/1988  . Smokeless tobacco: Never Used  . Alcohol Use: No  . Drug Use: No  . Sexually Active: Yes -- Female partner(s)   Other Topics Concern  . Not on file   Social History Narrative  . No narrative on file    Past Surgical History  Procedure Date  . Abdominal hysterectomy 05/05/2011    TAH---fibroids and endometriosis  . Cholecystectomy 02/2010    Family History  Problem Relation Age of Onset  . Diabetes Mother   . Kidney disease Mother   . Hypertension Father   . Arthritis Father     rheumatoid  . Heart disease Father   . Cancer Paternal Uncle     liver with mets   . Heart disease Paternal Grandmother   . Cancer Paternal Grandmother     breast    No Known Allergies  Current Outpatient Prescriptions on File Prior to Visit  Medication Sig Dispense Refill  . lisinopril-hydrochlorothiazide (PRINZIDE,ZESTORETIC) 10-12.5 MG per tablet Take 1 tablet by mouth daily.  30 tablet  2  . ALPRAZolam (XANAX) 0.5 MG tablet Take 1 table 30 minutes prior to procedure and bring meds with you to the procedure  10 tablet  0    BP 100/70  Pulse 75  Temp 97.9 F (36.6 C) (Oral)  Resp 16  Wt 193 lb 1.3 oz (87.581 kg)  SpO2 99%       Objective:   Physical Exam   Constitutional: She appears well-developed and well-nourished. No distress.  HENT:  Head: Normocephalic and atraumatic.  Right Ear: Tympanic membrane and ear canal normal.  Left Ear: Tympanic membrane and ear canal normal.       Large tonsils, mild erythema without exudates.   Cardiovascular: Normal rate and regular rhythm.   No murmur heard. Pulmonary/Chest: Effort normal and breath sounds normal. No respiratory distress. She has no wheezes. She has no rales. She exhibits no tenderness.  Psychiatric: She has a normal mood and affect. Her behavior is normal. Thought content normal.          Assessment & Plan:

## 2012-10-20 NOTE — Assessment & Plan Note (Addendum)
Rapid strep negative. Plan supportive measures, call if symptoms worsen or if no improvement in 2-3 days.

## 2012-11-25 ENCOUNTER — Other Ambulatory Visit: Payer: Self-pay | Admitting: Family Medicine

## 2012-12-25 ENCOUNTER — Encounter: Payer: Self-pay | Admitting: Family Medicine

## 2012-12-28 ENCOUNTER — Encounter: Payer: Self-pay | Admitting: Lab

## 2012-12-29 ENCOUNTER — Ambulatory Visit: Payer: 59 | Admitting: Family Medicine

## 2012-12-29 ENCOUNTER — Ambulatory Visit (INDEPENDENT_AMBULATORY_CARE_PROVIDER_SITE_OTHER): Payer: 59 | Admitting: Family Medicine

## 2012-12-29 ENCOUNTER — Encounter: Payer: Self-pay | Admitting: Family Medicine

## 2012-12-29 VITALS — BP 126/80 | HR 88 | Temp 98.3°F | Wt 197.2 lb

## 2012-12-29 DIAGNOSIS — F411 Generalized anxiety disorder: Secondary | ICD-10-CM

## 2012-12-29 DIAGNOSIS — I10 Essential (primary) hypertension: Secondary | ICD-10-CM

## 2012-12-29 DIAGNOSIS — F419 Anxiety disorder, unspecified: Secondary | ICD-10-CM

## 2012-12-29 MED ORDER — ALPRAZOLAM ER 0.5 MG PO TB24: 0.5000 mg | ORAL_TABLET | ORAL | Status: DC

## 2012-12-29 MED ORDER — LISINOPRIL-HYDROCHLOROTHIAZIDE 10-12.5 MG PO TABS: ORAL_TABLET | ORAL | Status: DC

## 2012-12-29 NOTE — Patient Instructions (Addendum)

## 2012-12-29 NOTE — Progress Notes (Signed)
  Subjective:    Patient here for follow-up of elevated blood pressure.  She is exercising and is adherent to a low-salt diet.  Blood pressure is well controlled at home. Cardiac symptoms: none. Patient denies: chest pain, chest pressure/discomfort, claudication, dyspnea, exertional chest pressure/discomfort, fatigue, irregular heart beat, lower extremity edema, near-syncope, orthopnea, palpitations, paroxysmal nocturnal dyspnea, syncope and tachypnea. Cardiovascular risk factors: hypertension and obesity (BMI >= 30 kg/m2). Use of agents associated with hypertension: none. History of target organ damage: none.  The following portions of the patient's history were reviewed and updated as appropriate: allergies, current medications, past family history, past medical history, past social history, past surgical history and problem list.  Review of Systems Pertinent items are noted in HPI.     Objective:    BP 126/80  Pulse 88  Temp 98.3 F (36.8 C) (Oral)  Wt 197 lb 3.2 oz (89.449 kg)  SpO2 98% General appearance: alert, cooperative, appears stated age and no distress Lungs: clear to auscultation bilaterally Heart: S1, S2 normal Extremities: extremities normal, atraumatic, no cyanosis or edema    Assessment:    Hypertension, normal blood pressure . Evidence of target organ damage: none.    Plan:    Medication: no change. Dietary sodium restriction. Regular aerobic exercise. Check blood pressures 2-3 times weekly and record. Follow up: 3 months and as needed.

## 2013-08-31 ENCOUNTER — Telehealth: Payer: Self-pay | Admitting: *Deleted

## 2013-08-31 NOTE — Telephone Encounter (Signed)
Pt states that her blood pressure is still elevated. 169/100-10/2, 168/98-10/01. Pt is currently taking lisinopril-hydrochlorothiazide 10-12.5mg . Pt states that she currently doesn't have any insurance but is willing to get another prescription if she needs to. Please advise. SW

## 2013-08-31 NOTE — Telephone Encounter (Signed)
She has not been seen since January--- she would have to be seen somewhere.  We can help her get on sliding scale with [facility] but she has to be seen

## 2013-08-31 NOTE — Telephone Encounter (Signed)
Called her to inform her of Dr.Lown's recommendations. Pt states she will schedule an appointment.

## 2014-01-25 ENCOUNTER — Ambulatory Visit: Payer: 59 | Admitting: Family Medicine

## 2014-01-25 ENCOUNTER — Encounter: Payer: Self-pay | Admitting: Family Medicine

## 2014-01-25 ENCOUNTER — Ambulatory Visit (INDEPENDENT_AMBULATORY_CARE_PROVIDER_SITE_OTHER): Payer: Self-pay | Admitting: Family Medicine

## 2014-01-25 VITALS — BP 154/92 | HR 76 | Temp 98.5°F | Wt 196.0 lb

## 2014-01-25 DIAGNOSIS — I10 Essential (primary) hypertension: Secondary | ICD-10-CM

## 2014-01-25 DIAGNOSIS — F419 Anxiety disorder, unspecified: Secondary | ICD-10-CM

## 2014-01-25 DIAGNOSIS — F411 Generalized anxiety disorder: Secondary | ICD-10-CM

## 2014-01-25 DIAGNOSIS — R079 Chest pain, unspecified: Secondary | ICD-10-CM

## 2014-01-25 MED ORDER — ALPRAZOLAM ER 0.5 MG PO TB24: 0.5000 mg | ORAL_TABLET | ORAL | Status: DC

## 2014-01-25 MED ORDER — LISINOPRIL-HYDROCHLOROTHIAZIDE 20-25 MG PO TABS: 1.0000 | ORAL_TABLET | Freq: Every day | ORAL | Status: DC

## 2014-01-25 NOTE — Progress Notes (Signed)
Pre visit review using our clinic review tool, if applicable. No additional management support is needed unless otherwise documented below in the visit note. 

## 2014-01-25 NOTE — Progress Notes (Signed)
  Subjective:    Patient here for follow-up of elevated blood pressure.  She is not exercising and is not adherent to a low-salt diet.  Blood pressure is not well controlled at home. Cardiac symptoms: chest pain and dyspnea. Patient denies: claudication, fatigue, irregular heart beat, lower extremity edema, near-syncope, orthopnea, palpitations, paroxysmal nocturnal dyspnea, syncope and tachypnea. Pt was seen in [facility] 01/01/2014 with chest pain radiating down L arm.  Pt was told she needed a stress test.   Cardiovascular risk factors: family history of premature cardiovascular disease, hypertension, obesity (BMI >= 30 kg/m2) and sedentary lifestyle. Use of agents associated with hypertension: none. History of target organ damage: none.  The following portions of the patient's history were reviewed and updated as appropriate:  She  has a past medical history of H/O endometritis. She  does not have any pertinent problems on file. She  has past surgical history that includes Abdominal hysterectomy (05/05/2011) and Cholecystectomy (02/2010). Her family history includes Arthritis in her father; Cancer in her paternal grandmother and paternal uncle; Diabetes in her mother; Heart disease in her father and paternal grandmother; Hypertension in her father; Kidney disease in her mother. She  reports that she quit smoking about 25 years ago. She has never used smokeless tobacco. She reports that she does not drink alcohol or use illicit drugs. She has a current medication list which includes the following prescription(s): alprazolam and lisinopril-hydrochlorothiazide. Current Outpatient Prescriptions on File Prior to Visit  Medication Sig Dispense Refill  . ALPRAZolam (XANAX XR) 0.5 MG 24 hr tablet Take 1 tablet (0.5 mg total) by mouth every morning.  30 tablet  0  . lisinopril-hydrochlorothiazide (PRINZIDE,ZESTORETIC) 10-12.5 MG per tablet 1 po qd  90 tablet  3   No current facility-administered medications on file  prior to visit.   She has No Known Allergies..  Review of Systems Pertinent items are noted in HPI.     Objective:    BP 154/92  Pulse 76  Temp(Src) 98.5 F (36.9 C) (Oral)  Wt 196 lb (88.905 kg)  SpO2 99% General appearance: alert, cooperative, appears stated age and no distress Nose: Nares normal. Septum midline. Mucosa normal. No drainage or sinus tenderness. Throat: lips, mucosa, and tongue normal; teeth and gums normal Neck: no adenopathy, no carotid bruit, no JVD, supple, symmetrical, trachea midline and thyroid not enlarged, symmetric, no tenderness/mass/nodules Lungs: clear to auscultation bilaterally Heart: regular rate and rhythm, S1, S2 normal, no murmur, click, rub or gallop Extremities: extremities normal, atraumatic, no cyanosis or edema    Assessment:    Hypertension, uncontrolled . Evidence of target organ damage: none.    Plan:    Medication: resume lisinopril . Dietary sodium restriction. Regular aerobic exercise. Check blood pressures 2-3 times weekly and record. Follow up: 3 weeks and as needed.

## 2014-01-25 NOTE — Patient Instructions (Signed)

## 2014-01-28 ENCOUNTER — Telehealth: Payer: Self-pay | Admitting: Family Medicine

## 2014-01-28 NOTE — Telephone Encounter (Signed)
Relevant patient education assigned to patient using Emmi. ° °

## 2014-02-08 ENCOUNTER — Encounter: Payer: Self-pay | Admitting: Family Medicine

## 2014-02-08 ENCOUNTER — Ambulatory Visit: Payer: Self-pay | Admitting: Family Medicine

## 2014-02-08 ENCOUNTER — Ambulatory Visit (INDEPENDENT_AMBULATORY_CARE_PROVIDER_SITE_OTHER): Payer: Self-pay | Admitting: Family Medicine

## 2014-02-08 VITALS — BP 126/78 | HR 87 | Temp 98.4°F | Wt 191.0 lb

## 2014-02-08 DIAGNOSIS — F4323 Adjustment disorder with mixed anxiety and depressed mood: Secondary | ICD-10-CM

## 2014-02-08 DIAGNOSIS — I10 Essential (primary) hypertension: Secondary | ICD-10-CM

## 2014-02-08 DIAGNOSIS — R748 Abnormal levels of other serum enzymes: Secondary | ICD-10-CM

## 2014-02-08 DIAGNOSIS — F411 Generalized anxiety disorder: Secondary | ICD-10-CM

## 2014-02-08 LAB — BASIC METABOLIC PANEL
BUN: 18 mg/dL (ref 6–23)
CHLORIDE: 103 meq/L (ref 96–112)
CO2: 23 mEq/L (ref 19–32)
Calcium: 9.3 mg/dL (ref 8.4–10.5)
Creatinine, Ser: 0.9 mg/dL (ref 0.4–1.2)
GFR: 71.55 mL/min (ref >60.00)
Glucose, Bld: 103 mg/dL — ABNORMAL HIGH (ref 70–99)
Potassium: 3.9 mEq/L (ref 3.5–5.1)
Sodium: 136 mEq/L (ref 135–145)

## 2014-02-08 LAB — CBC WITH DIFFERENTIAL/PLATELET
BASOS ABS: 0 10*3/uL (ref 0.0–0.1)
Basophils Relative: 0.4 % (ref 0.0–3.0)
EOS PCT: 2.8 % (ref 0.0–5.0)
Eosinophils Absolute: 0.2 10*3/uL (ref 0.0–0.7)
HCT: 39 % (ref 36.0–46.0)
Hemoglobin: 13 g/dL (ref 12.0–15.0)
LYMPHS ABS: 2.7 10*3/uL (ref 0.7–4.0)
Lymphocytes Relative: 30 % (ref 12.0–46.0)
MCHC: 33.4 g/dL (ref 30.0–36.0)
MCV: 85.7 fl (ref 78.0–100.0)
Monocytes Absolute: 0.5 10*3/uL (ref 0.1–1.0)
Monocytes Relative: 5.1 % (ref 3.0–12.0)
NEUTROS PCT: 61.7 % (ref 43.0–77.0)
Neutro Abs: 5.5 10*3/uL (ref 1.4–7.7)
Platelets: 299 10*3/uL (ref 150.0–400.0)
RBC: 4.55 Mil/uL (ref 3.87–5.11)
RDW: 13 % (ref 11.5–14.6)
WBC: 8.9 10*3/uL (ref 4.5–10.5)

## 2014-02-08 LAB — LIPID PANEL
CHOL/HDL RATIO: 3
Cholesterol: 142 mg/dL (ref 0–200)
HDL: 48.2 mg/dL (ref >39.00)
LDL CALC: 78 mg/dL (ref 0–99)
Triglycerides: 81 mg/dL (ref 0.0–149.0)
VLDL: 16.2 mg/dL (ref 0.0–40.0)

## 2014-02-08 LAB — HEPATIC FUNCTION PANEL
ALT: 21 U/L (ref 0–35)
AST: 20 U/L (ref 0–37)
Albumin: 4.1 g/dL (ref 3.5–5.2)
Alkaline Phosphatase: 121 U/L — ABNORMAL HIGH (ref 39–117)
BILIRUBIN TOTAL: 0.3 mg/dL (ref 0.3–1.2)
Bilirubin, Direct: 0 mg/dL (ref 0.0–0.3)
Total Protein: 7.2 g/dL (ref 6.0–8.3)

## 2014-02-08 NOTE — Progress Notes (Signed)
  Subjective:    Patient here for follow-up of elevated blood pressure.  She is not exercising and is adherent to a low-salt diet.  Blood pressure is well controlled at home. Cardiac symptoms: none. Patient denies: chest pain, chest pressure/discomfort, claudication, dyspnea, exertional chest pressure/discomfort, fatigue, irregular heart beat, lower extremity edema, near-syncope, orthopnea, palpitations, paroxysmal nocturnal dyspnea, syncope and tachypnea. Cardiovascular risk factors: hypertension and sedentary lifestyle. Use of agents associated with hypertension: none. History of target organ damage: none.  The following portions of the patient's history were reviewed and updated as appropriate: allergies, current medications, past family history, past medical history, past social history, past surgical history and problem list.  Review of Systems Pertinent items are noted in HPI.     Objective:    BP 126/78  Pulse 87  Temp(Src) 98.4 F (36.9 C) (Oral)  Wt 191 lb (86.637 kg)  SpO2 100% General appearance: alert, cooperative, appears stated age and no distress Neck: no adenopathy, supple, symmetrical, trachea midline and thyroid not enlarged, symmetric, no tenderness/mass/nodules Lungs: clear to auscultation bilaterally Heart: S1, S2 normal Extremities: extremities normal, atraumatic, no cyanosis or edema    Assessment:    Hypertension, normal blood pressure . Evidence of target organ damage: none.    Plan:    Medication: no change. Dietary sodium restriction. Regular aerobic exercise. Check blood pressures 2-3 times weekly and record. Follow up: 6 months and as needed.   1. HTN (hypertension) Stable, con't meds  2. Adjustment disorder with mixed anxiety and depressed mood con't meds  3. Generalized anxiety disorder   4. Elevated alkaline phosphatase level Check labs - Hepatic function panel; Future - Gamma GT; Future

## 2014-02-08 NOTE — Patient Instructions (Signed)

## 2014-02-15 ENCOUNTER — Institutional Professional Consult (permissible substitution): Payer: Self-pay | Admitting: Cardiology

## 2014-02-19 ENCOUNTER — Institutional Professional Consult (permissible substitution): Payer: Self-pay | Admitting: Cardiovascular Disease

## 2014-03-06 ENCOUNTER — Telehealth: Payer: Self-pay | Admitting: Family Medicine

## 2014-03-06 NOTE — Telephone Encounter (Signed)
Spoke with patient and advised to try OTC Coricidin for congestions and nasal saline washes to see if that helps. Patient agreed to do so.

## 2014-03-06 NOTE — Telephone Encounter (Signed)
Patient called and stated that she is congested and has a cough. Patient is wanting to know what can she take OTC that will not make her blood pressure go up. Patient has tried mucinex and her blood pressure did go up. Also patient stated that dr Etter Sjogren is aware of her situation being a self pay patient not able to come in to be seen. Please advise.

## 2014-03-20 ENCOUNTER — Ambulatory Visit: Payer: Self-pay | Admitting: Cardiology

## 2014-04-02 ENCOUNTER — Ambulatory Visit (INDEPENDENT_AMBULATORY_CARE_PROVIDER_SITE_OTHER): Payer: Self-pay | Admitting: Family Medicine

## 2014-04-02 ENCOUNTER — Encounter: Payer: Self-pay | Admitting: Family Medicine

## 2014-04-02 VITALS — BP 124/84 | HR 83 | Temp 98.1°F | Wt 197.0 lb

## 2014-04-02 DIAGNOSIS — S0500XA Injury of conjunctiva and corneal abrasion without foreign body, unspecified eye, initial encounter: Secondary | ICD-10-CM

## 2014-04-02 DIAGNOSIS — S058X9A Other injuries of unspecified eye and orbit, initial encounter: Secondary | ICD-10-CM

## 2014-04-02 DIAGNOSIS — F411 Generalized anxiety disorder: Secondary | ICD-10-CM

## 2014-04-02 DIAGNOSIS — F419 Anxiety disorder, unspecified: Secondary | ICD-10-CM

## 2014-04-02 MED ORDER — SULFACETAMIDE-PREDNISOLONE 10-0.2 % OP OINT: 1.0000 "application " | TOPICAL_OINTMENT | Freq: Four times a day (QID) | OPHTHALMIC | Status: DC

## 2014-04-02 MED ORDER — ALPRAZOLAM ER 0.5 MG PO TB24: 0.5000 mg | ORAL_TABLET | ORAL | Status: DC

## 2014-04-02 NOTE — Patient Instructions (Signed)
Corneal Abrasion  The cornea is the clear covering at the front and center of the eye. When looking at the colored portion of the eye (iris), you are looking through the cornea. This very thin tissue is made up of many layers. The surface layer is a single layer of cells (corneal epithelium) and is one of the most sensitive tissues in the body. If a scratch or injury causes the corneal epithelium to come off, it is called a corneal abrasion. If the injury extends to the tissues below the epithelium, the condition is called a corneal ulcer.  CAUSES    Scratches.   Trauma.   Foreign body in the eye.  Some people have recurrences of abrasions in the area of the original injury even after it has healed (recurrent erosion syndrome). Recurrent erosion syndrome generally improves and goes away with time.  SYMPTOMS    Eye pain.   Difficulty or inability to keep the injured eye open.   The eye becomes very sensitive to light.   Recurrent erosions tend to happen suddenly, first thing in the morning, usually after waking up and opening the eye.  DIAGNOSIS   Your health care provider can diagnose a corneal abrasion during an eye exam. Dye is usually placed in the eye using a drop or a small paper strip moistened by your tears. When the eye is examined with a special light, the abrasion shows up clearly because of the dye.  TREATMENT    Small abrasions may be treated with antibiotic drops or ointment alone.   Usually a pressure patch is specially applied. Pressure patches prevent the eye from blinking, allowing the corneal epithelium to heal. A pressure patch also reduces the amount of pain present in the eye during healing. Most corneal abrasions heal within 2 3 days with no effect on vision.  If the abrasion becomes infected and spreads to the deeper tissues of the cornea, a corneal ulcer can result. This is serious because it can cause corneal scarring. Corneal scars interfere with light passing through the cornea  and cause a loss of vision in the involved eye.  HOME CARE INSTRUCTIONS   Use medicine or ointment as directed. Only take over-the-counter or prescription medicines for pain, discomfort, or fever as directed by your health care provider.   Do not drive or operate machinery while your eye is patched. Your ability to judge distances is impaired.   If your health care provider has given you a follow-up appointment, it is very important to keep that appointment. Not keeping the appointment could result in a severe eye infection or permanent loss of vision. If there is any problem keeping the appointment, let your health care provider know.  SEEK MEDICAL CARE IF:    You have pain, light sensitivity, and a scratchy feeling in one eye or both eyes.   Your pressure patch keeps loosening up, and you can blink your eye under the patch after treatment.   Any kind of discharge develops from the eye after treatment or if the lids stick together in the morning.   You have the same symptoms in the morning as you did with the original abrasion days, weeks, or months after the abrasion healed.  MAKE SURE YOU:    Understand these instructions.   Will watch your condition.   Will get help right away if you are not doing well or get worse.  Document Released: 11/12/2000 Document Revised: 09/05/2013 Document Reviewed: 07/23/2013  [employer] Patient Information   2014 [employer].

## 2014-04-02 NOTE — Progress Notes (Signed)
Pre visit review using our clinic review tool, if applicable. No additional management support is needed unless otherwise documented below in the visit note. 

## 2014-04-04 NOTE — Progress Notes (Signed)
   Subjective:    Patient ID: [name], female    DOB: 10/04/1971, 43 y.o.   MRN: [id number]  Conjunctivitis   pt here c/o pain and irritaion L eye--- crusted closed this am and felt like it was tearing when she opened them. No contacts    Review of Systems    as above Objective:   Physical Exam  Eyes: Right eye exhibits no chemosis, no discharge, no exudate and no hordeolum. No foreign body present in the right eye. Left eye exhibits chemosis. Left eye exhibits no discharge, no exudate and no hordeolum. No foreign body present in the left eye. Right conjunctiva is not injected. Right conjunctiva has no hemorrhage. Left conjunctiva is injected. Left conjunctiva has no hemorrhage. No scleral icterus.            Assessment & Plan:  1. Anxiety Refill meds - ALPRAZolam (XANAX XR) 0.5 MG 24 hr tablet; Take 1 tablet (0.5 mg total) by mouth every morning.  Dispense: 30 tablet; Refill: 0  2. Corneal abrasion abx gtts, oph referral---pain out of proportion to injury - Ambulatory referral to Ophthalmology - sulf[facility]inde[facility]predn[facility]ONE [facility]) ophthalmic ointment; Place 1 application into the left eye 4 (four) times daily.  Dispense: 3.5 g; Refill: 0

## 2014-06-06 ENCOUNTER — Other Ambulatory Visit: Payer: Self-pay | Admitting: Family Medicine

## 2014-06-07 ENCOUNTER — Encounter: Payer: Self-pay | Admitting: General Practice

## 2014-06-07 NOTE — Telephone Encounter (Signed)
Last OV 04-02-14  Med filled same day #30 with 0  NO CSC or uds on file

## 2014-06-07 NOTE — Telephone Encounter (Signed)
Needs CSC

## 2014-06-07 NOTE — Telephone Encounter (Signed)
Med filled and pt notified available at front desk.

## 2014-07-09 ENCOUNTER — Other Ambulatory Visit: Payer: Self-pay | Admitting: Family Medicine

## 2014-07-09 NOTE — Telephone Encounter (Signed)
Last seen 04/02/14 and filled 06/07/14 #30  Please advise    KP

## 2014-08-08 ENCOUNTER — Other Ambulatory Visit: Payer: Self-pay | Admitting: Family Medicine

## 2014-08-08 NOTE — Telephone Encounter (Signed)
Last seen 04/02/14 and filled 07/09/14 #30.  Please advise     KP

## 2014-08-12 ENCOUNTER — Ambulatory Visit: Payer: Self-pay | Admitting: Family Medicine

## 2014-09-18 ENCOUNTER — Telehealth: Payer: Self-pay | Admitting: Family Medicine

## 2014-09-18 NOTE — Telephone Encounter (Signed)
Xanax Refill  Last seen 04/02/14 and filled 08/08/14 #30 No UDS   Please advise    KP

## 2014-09-18 NOTE — Telephone Encounter (Signed)
done

## 2014-11-20 ENCOUNTER — Other Ambulatory Visit: Payer: Self-pay | Admitting: Family Medicine

## 2014-11-21 NOTE — Telephone Encounter (Signed)
Last filled: 09/18/14 Amt: 30, 0 refills No UDS or contract  Please advise.

## 2014-11-25 ENCOUNTER — Encounter: Payer: Self-pay | Admitting: Family Medicine

## 2014-11-25 ENCOUNTER — Ambulatory Visit (HOSPITAL_BASED_OUTPATIENT_CLINIC_OR_DEPARTMENT_OTHER)
Admission: RE | Admit: 2014-11-25 | Discharge: 2014-11-25 | Disposition: A | Discharge status: Home / Self Care | Payer: Self-pay | Source: Ambulatory Visit | Attending: Family Medicine | Admitting: Family Medicine

## 2014-11-25 ENCOUNTER — Ambulatory Visit (INDEPENDENT_AMBULATORY_CARE_PROVIDER_SITE_OTHER): Payer: Self-pay | Admitting: Family Medicine

## 2014-11-25 VITALS — BP 125/87 | HR 118 | Temp 101.9°F | Wt 183.0 lb

## 2014-11-25 DIAGNOSIS — R52 Pain, unspecified: Secondary | ICD-10-CM

## 2014-11-25 DIAGNOSIS — J208 Acute bronchitis due to other specified organisms: Secondary | ICD-10-CM

## 2014-11-25 DIAGNOSIS — R509 Fever, unspecified: Secondary | ICD-10-CM

## 2014-11-25 DIAGNOSIS — R059 Cough, unspecified: Secondary | ICD-10-CM

## 2014-11-25 DIAGNOSIS — R0989 Other specified symptoms and signs involving the circulatory and respiratory systems: Secondary | ICD-10-CM | POA: Insufficient documentation

## 2014-11-25 DIAGNOSIS — R05 Cough: Secondary | ICD-10-CM

## 2014-11-25 DIAGNOSIS — F411 Generalized anxiety disorder: Secondary | ICD-10-CM

## 2014-11-25 LAB — POCT INFLUENZA A/B
INFLUENZA A, POC: NEGATIVE
Influenza B, POC: NEGATIVE

## 2014-11-25 MED ORDER — AZITHROMYCIN 250 MG PO TABS: ORAL_TABLET | ORAL | Status: DC

## 2014-11-25 MED ORDER — PREDNISONE 10 MG PO TABS: ORAL_TABLET | ORAL | Status: DC

## 2014-11-25 MED ORDER — ALPRAZOLAM ER 0.5 MG PO TB24: 0.5000 mg | ORAL_TABLET | Freq: Every morning | ORAL | Status: DC

## 2014-11-25 NOTE — Progress Notes (Signed)
  Subjective:     [name] is a 43 y.o. female here for evaluation of a cough. Onset of symptoms was 1 week ago. Symptoms have been gradually worsening since that time. The cough is productive and is aggravated by infection and reclining position. Associated symptoms include: chills, fever, shortness of breath, sputum production and body aches. Patient does not have a history of asthma. Patient does not have a history of environmental allergens. Patient has not traveled recently. Patient does not have a history of smoking. Patient has not had a previous chest x-ray. Patient has not had a PPD done.  The following portions of the patient's history were reviewed and updated as appropriate: allergies, current medications, past family history, past medical history, past social history, past surgical history and problem list.  Review of Systems Pertinent items are noted in HPI.    Objective:    Oxygen saturation 95% on room air BP 125/87 mmHg  Pulse 118  Temp(Src) 101.9 F (38.8 C) (Oral)  Wt 183 lb (83.008 kg)  SpO2 95% General appearance: alert, cooperative, appears stated age and no distress Ears: normal TM's and external ear canals both ears Nose: yellow discharge, mild congestion, turbinates red, swollen, no sinus tenderness Throat: abnormal findings: mild oropharyngeal erythema and + PND Neck: mild anterior cervical adenopathy, supple, symmetrical, trachea midline and thyroid not enlarged, symmetric, no tenderness/mass/nodules Lungs: diminished breath sounds RUL Heart: S1, S2 normal    Assessment:    Acute Bronchitis and COPD with exacerbation    Plan:    Antibiotics per medication orders. Antitussives per medication orders. Avoid exposure to tobacco smoke and fumes. B-agonist inhaler. Call if shortness of breath worsens, blood in sputum, change in character of cough, development of fever or chills, inability to maintain nutrition and hydration. Avoid exposure to  tobacco smoke and fumes. Chest x-ray.

## 2014-11-25 NOTE — Progress Notes (Signed)
Pre visit review using our clinic review tool, if applicable. No additional management support is needed unless otherwise documented below in the visit note. 

## 2014-11-25 NOTE — Patient Instructions (Signed)

## 2014-11-26 ENCOUNTER — Telehealth: Payer: Self-pay | Admitting: *Deleted

## 2014-11-26 DIAGNOSIS — J208 Acute bronchitis due to other specified organisms: Secondary | ICD-10-CM

## 2014-11-26 MED ORDER — AZITHROMYCIN 250 MG PO TABS: ORAL_TABLET | ORAL | Status: DC

## 2014-11-26 NOTE — Telephone Encounter (Signed)
-----   Message from Rosalita Chessman, DO sent at 11/25/2014  4:47 PM EST ----- + pneumonia--- will need refill on z pack and con't to take 1 day after first pack F/u 2 weeks or sooner prn

## 2014-11-26 NOTE — Telephone Encounter (Signed)
Called patient and discussed chest x-ray results.  Positive for pneumonia.  Patient aware that she has pneumonia and patient aware that she is to complete a second z-pack.  Patient asked about a work note.  Notified patient that she has a work note through 11/30/14.  Asked patient to call if she is not feeling better or has any other concerns.  Patient verbalized understanding.   F/u appointment scheduled for two weeks.

## 2014-12-06 ENCOUNTER — Ambulatory Visit (INDEPENDENT_AMBULATORY_CARE_PROVIDER_SITE_OTHER): Payer: Self-pay | Admitting: Family Medicine

## 2014-12-06 ENCOUNTER — Encounter: Payer: Self-pay | Admitting: Family Medicine

## 2014-12-06 VITALS — BP 136/82 | HR 96 | Temp 98.2°F | Resp 18

## 2014-12-06 DIAGNOSIS — J189 Pneumonia, unspecified organism: Secondary | ICD-10-CM

## 2014-12-06 DIAGNOSIS — F411 Generalized anxiety disorder: Secondary | ICD-10-CM

## 2014-12-06 MED ORDER — HYDROCODONE-HOMATROPINE 5-1.5 MG/5ML PO SYRP: 5.0000 mL | ORAL_SOLUTION | Freq: Three times a day (TID) | ORAL | Status: DC | PRN

## 2014-12-06 MED ORDER — LEVOFLOXACIN 500 MG PO TABS: 500.0000 mg | ORAL_TABLET | Freq: Every day | ORAL | Status: DC

## 2014-12-06 MED ORDER — ALPRAZOLAM ER 0.5 MG PO TB24: 0.5000 mg | ORAL_TABLET | Freq: Every morning | ORAL | Status: DC

## 2014-12-06 NOTE — Progress Notes (Signed)
Pre visit review using our clinic review tool, if applicable. No additional management support is needed unless otherwise documented below in the visit note. 

## 2014-12-06 NOTE — Progress Notes (Signed)
  Subjective:     [name] is a 44 y.o. female here for f/u of pneumonia. Onset of symptoms was 2 weeks ago. Symptoms have been gradually improving since that time. The cough is dry, nocturnal and painful and is aggravated by infection and reclining position. Associated symptoms include: wheezing. Patient does not have a history of asthma. Patient does not have a history of environmental allergens. Patient has not traveled recently. Patient does not have a history of smoking. Patient has had a previous chest x-ray. Patient has had a PPD done.  The following portions of the patient's history were reviewed and updated as appropriate:  She  has a past medical history of H/O endometritis; Hypertension; and Anxiety. She  does not have any pertinent problems on file. She  has past surgical history that includes Abdominal hysterectomy (05/05/2011) and Cholecystectomy (02/2010). Her family history includes Arthritis in her father; Cancer in her paternal grandmother and paternal uncle; Diabetes in her mother; Heart disease in her father and paternal grandmother; Hypertension in her father; Kidney disease in her mother. She  reports that she quit smoking about 26 years ago. She has never used smokeless tobacco. She reports that she does not drink alcohol or use illicit drugs. She has a current medication list which includes the following prescription(s): alprazolam, hydrocodone-homatropine, levofloxacin, lisinopril-hydrochlorothiazide, and sulfacetaminde-prednisolone. Current Outpatient Prescriptions on File Prior to Visit  Medication Sig Dispense Refill  . lisinopril-hydrochlorothiazide (PRINZIDE,ZESTORETIC) 20-25 MG per tablet Take 1 tablet by mouth daily. 90 tablet 3  . sulfacetaminde-prednisoLONE (BLEPHAMIDE) ophthalmic ointment Place 1 application into the left eye 4 (four) times daily. 3.5 g 0   No current facility-administered medications on file prior to visit.   She has No Known  Allergies..  Review of Systems Pertinent items are noted in HPI.    Objective:    Oxygen saturation 97% on room air BP 136/82 mmHg  Pulse 96  Temp(Src) 98.2 F (36.8 C) (Oral)  Resp 18  Wt   SpO2 97% General appearance: alert, cooperative, appears stated age and no distress Ears: normal TM's and external ear canals both ears Nose: Nares normal. Septum midline. Mucosa normal. No drainage or sinus tenderness. Throat: lips, mucosa, and tongue normal; teeth and gums normal Neck: no adenopathy, no carotid bruit, no JVD, supple, symmetrical, trachea midline and thyroid not enlarged, symmetric, no tenderness/mass/nodules Lungs: diminished breath sounds bilaterally Heart: S1, S2 normal Extremities: extremities normal, atraumatic, no cyanosis or edema    Assessment:    Pneumonia    Plan:    Antibiotics per medication orders. Antitussives per medication orders. Avoid exposure to tobacco smoke and fumes. B-agonist inhaler. Chest x-ray.

## 2014-12-06 NOTE — Patient Instructions (Signed)

## 2015-01-29 ENCOUNTER — Ambulatory Visit (INDEPENDENT_AMBULATORY_CARE_PROVIDER_SITE_OTHER): Payer: Self-pay | Admitting: Family Medicine

## 2015-01-29 ENCOUNTER — Telehealth: Payer: Self-pay | Admitting: Family Medicine

## 2015-01-29 ENCOUNTER — Encounter: Payer: Self-pay | Admitting: Family Medicine

## 2015-01-29 VITALS — BP 126/80 | HR 96 | Temp 98.5°F | Resp 16 | Wt 186.0 lb

## 2015-01-29 DIAGNOSIS — F418 Other specified anxiety disorders: Secondary | ICD-10-CM

## 2015-01-29 DIAGNOSIS — F419 Anxiety disorder, unspecified: Secondary | ICD-10-CM

## 2015-01-29 DIAGNOSIS — F329 Major depressive disorder, single episode, unspecified: Secondary | ICD-10-CM

## 2015-01-29 MED ORDER — CITALOPRAM HYDROBROMIDE 20 MG PO TABS: 20.0000 mg | ORAL_TABLET | Freq: Every day | ORAL | Status: DC

## 2015-01-29 NOTE — Progress Notes (Signed)
Pre visit review using our clinic review tool, if applicable. No additional management support is needed unless otherwise documented below in the visit note. 

## 2015-01-29 NOTE — Progress Notes (Signed)
   Subjective:    Patient ID: [name], female    DOB: December 13, 1970, 44 y.o.   MRN: [id number]  HPI 'i am depressed'- 'i get angry when I'm driving'.  Started swearing which is out of character.  'my memory isn't right'.  Pt reports marriage is good, bills are paid, 'i just want to run away b/c I can't deal w/ anything'.  Pt uses xanax prn for anxiety.  Pt reports sxs dramatically worsened over the weekend.  Denies work stress, home stress.  'it's just something is off'.  Pt has thoughts of self harm but 'i logically know that I wouldn't do it'.  'i'm angry and i can't figure out why'.  'i don't want to do anything'.  + family hx of depression.   Review of Systems For ROS see HPI     Objective:   Physical Exam  Constitutional: She is oriented to person, place, and time. She appears well-developed and well-nourished. No distress.  HENT:  Head: Normocephalic and atraumatic.  Neurological: She is alert and oriented to person, place, and time.  Skin: Skin is warm and dry.  Psychiatric:  Angry, near tears Normal behavior Thought content is linear  Vitals reviewed.         Assessment & Plan:

## 2015-01-29 NOTE — Patient Instructions (Signed)
Follow up in 3-4 weeks to recheck mood (Dr Etter Sjogren) Start the Citalopram 1/2 tab daily x1 week and then increase to 1 tab daily Try and find a stress outlet- art, music, exercise, journal writing, etc Call with any questions or concerns Hang in there!!!  You can do this!!

## 2015-01-29 NOTE — Telephone Encounter (Signed)
Dr. Etter Sjogren is out of the office.  Pt has an appointment scheduled with Dr. Birdie Riddle today at 11:30 am.

## 2015-01-29 NOTE — Telephone Encounter (Signed)
Patient called in with emotional issues. Depressed and angry. I asked patient is she has had suicidal thoughts and she states "at times, yes". Call transferred to [facility].

## 2015-01-29 NOTE — Assessment & Plan Note (Signed)
New.  Pt's anger, memory issues, racing thoughts, poor sleep, increased anxiety are all consistent w/ depression/anxiety.  Suspect this has been going on for awhile but just came to a head this weekend.  Pt able to contract for safety.  Start daily SSRI for symptom improvement.  Pt to f/u w/ PCP.  Pt expressed understanding and is in agreement w/ plan.

## 2015-01-29 NOTE — Telephone Encounter (Signed)
Patient Name: [name]  DOB: 23-Apr-1971    Initial Comment Caller states the patient is having suicidal thoughts.    Nurse Assessment  Nurse: Julien Girt, RN, Almyra Free Date/Time Eilene Ghazi Time): 01/29/2015 8:13:17 AM  Confirm and document reason for call. If symptomatic, describe symptoms. 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 She lost her keys, is typically very organized and does not know why. Right now she feels helpless, hopeless but has never decided to intentionally hurt herself. She denies suicidal thoughts and has never tried to hurt herself. She is sitting in the parking lot of a pt residence and wants to see her doctor. Kenedi adamantly denies any suicidal thought at this time, she just wants to see her doctor.  Has the patient traveled out of the country within the last 30 days? ---Not Applicable  Does the patient require triage? ---Yes  Related visit to physician within the last 2 weeks? ---No  Does the PT have any chronic conditions? (i.e. diabetes, asthma, etc.) ---Yes  List chronic conditions. ---hx anxiety/htn  Did the patient indicate they were pregnant? ---No     Guidelines    Guideline Title Affirmed Question Affirmed Notes  Depression [1] Depression AND [2] worsening (e.g.,sleeping poorly, less able to do activities of daily living)    Final Disposition User   See Physician within [place], Therapist, sports, [place]

## 2015-02-18 ENCOUNTER — Ambulatory Visit (INDEPENDENT_AMBULATORY_CARE_PROVIDER_SITE_OTHER): Payer: Self-pay | Admitting: Family Medicine

## 2015-02-18 ENCOUNTER — Encounter: Payer: Self-pay | Admitting: Family Medicine

## 2015-02-18 ENCOUNTER — Other Ambulatory Visit: Payer: Self-pay | Admitting: Family Medicine

## 2015-02-18 VITALS — BP 100/64 | HR 80 | Temp 99.8°F | Wt 189.8 lb

## 2015-02-18 DIAGNOSIS — F411 Generalized anxiety disorder: Secondary | ICD-10-CM

## 2015-02-18 DIAGNOSIS — F419 Anxiety disorder, unspecified: Secondary | ICD-10-CM

## 2015-02-18 DIAGNOSIS — I10 Essential (primary) hypertension: Secondary | ICD-10-CM

## 2015-02-18 DIAGNOSIS — F418 Other specified anxiety disorders: Secondary | ICD-10-CM

## 2015-02-18 DIAGNOSIS — F329 Major depressive disorder, single episode, unspecified: Secondary | ICD-10-CM

## 2015-02-18 DIAGNOSIS — F32A Depression, unspecified: Secondary | ICD-10-CM

## 2015-02-18 MED ORDER — ALPRAZOLAM ER 0.5 MG PO TB24: ORAL_TABLET | ORAL | Status: DC

## 2015-02-18 MED ORDER — LISINOPRIL-HYDROCHLOROTHIAZIDE 20-25 MG PO TABS: 1.0000 | ORAL_TABLET | Freq: Every day | ORAL | Status: DC

## 2015-02-18 MED ORDER — CITALOPRAM HYDROBROMIDE 40 MG PO TABS: 40.0000 mg | ORAL_TABLET | Freq: Every day | ORAL | Status: DC

## 2015-02-18 NOTE — Progress Notes (Signed)
Pre visit review using our clinic review tool, if applicable. No additional management support is needed unless otherwise documented below in the visit note. 

## 2015-02-18 NOTE — Progress Notes (Signed)
Patient ID: [name], female    DOB: 1971/07/24  Age: 44 y.o. MRN: [id number]     Subjective:  Subjective2  HPI [name] presents for f/u anxiety / depression.  Pt has legal custody of 2 boys ---the mother wrote a letter taking the kids back-- so they are now gone with the mom ---pt and her husband have been crying and not wanting to get out of bed.    Review of Systems  Constitutional: Positive for fatigue. Negative for activity change, appetite change and unexpected weight change.  Respiratory: Negative for cough and shortness of breath.   Cardiovascular: Negative for chest pain and palpitations.  Psychiatric/Behavioral: Positive for sleep disturbance, dysphoric mood and decreased concentration. Negative for suicidal ideas, behavioral problems and self-injury. The patient is nervous/anxious.     History Past Medical History  Diagnosis Date  . H/O endometritis   . Hypertension   . Anxiety     She has past surgical history that includes Abdominal hysterectomy (05/05/2011) and Cholecystectomy (02/2010).   Her family history includes Arthritis in her father; Cancer in her paternal grandmother and paternal uncle; Diabetes in her mother; Heart disease in her father and paternal grandmother; Hypertension in her father; Kidney disease in her mother.She reports that she quit smoking about 26 years ago. She has never used smokeless tobacco. She reports that she does not drink alcohol or use illicit drugs.  Current Outpatient Prescriptions on File Prior to Visit  Medication Sig Dispense Refill  . lisinopril-hydrochlorothiazide (PRINZIDE,ZESTORETIC) 20-25 MG per tablet Take 1 tablet by mouth daily. 90 tablet 3   No current facility-administered medications on file prior to visit.     Objective:  Objective Physical Exam  Constitutional: She is oriented to person, place, and time. She appears well-developed and well-nourished. No distress.  HENT:  Right Ear: External ear  normal.  Left Ear: External ear normal.  Nose: Nose normal.  Mouth/Throat: Oropharynx is clear and moist.  Eyes: EOM are normal. Pupils are equal, round, and reactive to light.  Neck: Normal range of motion. Neck supple.  Cardiovascular: Normal rate, regular rhythm and normal heart sounds.   No murmur heard. Pulmonary/Chest: Effort normal and breath sounds normal. No respiratory distress. She has no wheezes. She has no rales. She exhibits no tenderness.  Neurological: She is alert and oriented to person, place, and time.  Psychiatric: Her behavior is normal. Judgment and thought content normal. Her mood appears anxious. Her affect is not angry, not blunt, not labile and not inappropriate. She exhibits a depressed mood.   BP 100/64 mmHg  Pulse 80  Temp(Src) 99.8 F (37.7 C) (Oral)  Wt 189 lb 12.8 oz (86.093 kg)  SpO2 98% Wt Readings from Last 3 Encounters:  02/18/15 189 lb 12.8 oz (86.093 kg)  01/29/15 186 lb (84.369 kg)  11/25/14 183 lb (83.008 kg)     Lab Results  Component Value Date   WBC 8.9 02/08/2014   HGB 13.0 02/08/2014   HCT 39.0 02/08/2014   PLT 299.0 02/08/2014   GLUCOSE 103* 02/08/2014   CHOL 142 02/08/2014   TRIG 81.0 02/08/2014   HDL 48.20 02/08/2014   LDLCALC 78 02/08/2014   ALT 21 02/08/2014   AST 20 02/08/2014   NA 136 02/08/2014   K 3.9 02/08/2014   CL 103 02/08/2014   CREATININE 0.9 02/08/2014   BUN 18 02/08/2014   CO2 23 02/08/2014   TSH 2.06 08/31/2012   HGBA1C 5.3 08/31/2012   MICROALBUR 2.8*  08/31/2012    Dg Chest 2 View  11/25/2014   CLINICAL DATA:  Cough and congestion.  EXAM: CHEST  2 VIEW  COMPARISON:  02/19/2010.  FINDINGS: Right middle lobe consolidation consistent with pneumonia noted. Left lung is clear. Heart size normal. No pleural effusion or pneumothorax. No acute bony abnormality.  IMPRESSION: Right middle lobe consolidation consistent with pneumonia.   Electronically Signed   By: Marcello Moores  Register   On: 11/25/2014 15:41       Assessment & Plan:  Plan   Increase celexa 40 mg daily  con't prn xanax I have discontinued Ms. Coto's citalopram. I have also changed her ALPRAZolam. Additionally, I am having her start on citalopram. Lastly, I am having her maintain her lisinopril-hydrochlorothiazide.  Meds ordered this encounter  Medications  . citalopram (CELEXA) 40 MG tablet    Sig: Take 1 tablet (40 mg total) by mouth daily.    Dispense:  30 tablet    Refill:  3  . ALPRAZolam (XANAX XR) 0.5 MG 24 hr tablet    Sig: 1 po tid prn    Dispense:  30 tablet    Refill:  2    Problem List Items Addressed This Visit    Anxiety and depression - Primary   Relevant Medications   citalopram (CELEXA) tablet   ALPRAZolam (XANAX XR) 24 hr tablet    Other Visit Diagnoses    Generalized anxiety disorder        Relevant Medications    ALPRAZolam (XANAX XR) 24 hr tablet       Follow-up: Return in about 3 months (around 05/21/2015), or if symptoms worsen or fail to improve, for f/u anxiety , depression.  Garnet Koyanagi, DO

## 2015-02-18 NOTE — Patient Instructions (Signed)

## 2015-06-06 ENCOUNTER — Telehealth: Payer: Self-pay | Admitting: Family Medicine

## 2015-06-06 NOTE — Telephone Encounter (Signed)
Please advise      KP 

## 2015-06-06 NOTE — Telephone Encounter (Signed)
Ok to give note giving her permission to workout with trainer

## 2015-06-06 NOTE — Telephone Encounter (Signed)
Caller name: Maryruth Apple Relationship to patient: self Can be reached: [phone] Pharmacy:  Reason for call: Pt states that she needs a doctors note to provide her physical trainer at [employer] before he/she will work with her.

## 2015-06-09 NOTE — Telephone Encounter (Signed)
Patient is aware that the letter is ready for pick up.       KP

## 2015-08-22 ENCOUNTER — Telehealth: Payer: Self-pay | Admitting: Family Medicine

## 2015-08-22 DIAGNOSIS — F419 Anxiety disorder, unspecified: Secondary | ICD-10-CM

## 2015-08-22 DIAGNOSIS — F411 Generalized anxiety disorder: Secondary | ICD-10-CM

## 2015-08-22 DIAGNOSIS — F329 Major depressive disorder, single episode, unspecified: Secondary | ICD-10-CM

## 2015-08-22 DIAGNOSIS — F32A Depression, unspecified: Secondary | ICD-10-CM

## 2015-08-22 MED ORDER — ALPRAZOLAM ER 0.5 MG PO TB24
ORAL_TABLET | ORAL | Status: DC
Start: 2015-08-22 — End: 2016-04-23

## 2015-08-22 NOTE — Telephone Encounter (Signed)
Last seen and filled 02/18/15 #30 with 2 refills.  Please advise     KP

## 2015-08-22 NOTE — Telephone Encounter (Signed)
Refill x1 

## 2015-08-22 NOTE — Telephone Encounter (Signed)
Detailed message left on the pharmacy VM.      KP

## 2015-08-22 NOTE — Telephone Encounter (Signed)
Caller name: Penni Penado   Relationship to patient: Self   Can be reached:[phone]  Pharmacy: [facility]  Reason for call: Pt is requesting a refill on her ALPRAZolam Rx. She says it ran out before she could get it filled. She would like to have it refilled please.

## 2015-09-27 ENCOUNTER — Other Ambulatory Visit: Payer: Self-pay | Admitting: Family Medicine

## 2015-09-29 NOTE — Telephone Encounter (Signed)
Last seen 02/18/15 and filled 08/22/15 #30   Please advise      KP

## 2016-01-05 ENCOUNTER — Other Ambulatory Visit: Payer: Self-pay | Admitting: Family Medicine

## 2016-01-06 NOTE — Telephone Encounter (Signed)
Last seen 02/18/15 and filed 09/29/15 #30   Please advise    KP

## 2016-01-26 ENCOUNTER — Encounter: Payer: Self-pay | Admitting: Family Medicine

## 2016-01-26 ENCOUNTER — Ambulatory Visit (INDEPENDENT_AMBULATORY_CARE_PROVIDER_SITE_OTHER): Payer: PRIVATE HEALTH INSURANCE | Admitting: Family Medicine

## 2016-01-26 VITALS — BP 130/78 | HR 82 | Temp 98.7°F | Ht 60.0 in | Wt 203.0 lb

## 2016-01-26 DIAGNOSIS — Z23 Encounter for immunization: Secondary | ICD-10-CM | POA: Diagnosis not present

## 2016-01-26 DIAGNOSIS — I10 Essential (primary) hypertension: Secondary | ICD-10-CM | POA: Diagnosis not present

## 2016-01-26 DIAGNOSIS — R635 Abnormal weight gain: Secondary | ICD-10-CM | POA: Diagnosis not present

## 2016-01-26 DIAGNOSIS — F418 Other specified anxiety disorders: Secondary | ICD-10-CM

## 2016-01-26 DIAGNOSIS — F419 Anxiety disorder, unspecified: Secondary | ICD-10-CM

## 2016-01-26 DIAGNOSIS — F329 Major depressive disorder, single episode, unspecified: Secondary | ICD-10-CM

## 2016-01-26 LAB — POCT URINALYSIS DIPSTICK
BILIRUBIN UA: NEGATIVE
Glucose, UA: NEGATIVE
Ketones, UA: NEGATIVE
Leukocytes, UA: NEGATIVE
Nitrite, UA: NEGATIVE
Protein, UA: NEGATIVE
RBC UA: NEGATIVE
Spec Grav, UA: 1.025
UROBILINOGEN UA: NEGATIVE
pH, UA: 6

## 2016-01-26 MED ORDER — LISINOPRIL-HYDROCHLOROTHIAZIDE 20-25 MG PO TABS: 1.0000 | ORAL_TABLET | Freq: Every day | ORAL | Status: DC

## 2016-01-26 MED ORDER — ALPRAZOLAM 0.5 MG PO TABS: 0.5000 mg | ORAL_TABLET | Freq: Three times a day (TID) | ORAL | Status: DC | PRN

## 2016-01-26 MED ORDER — CITALOPRAM HYDROBROMIDE 40 MG PO TABS: 40.0000 mg | ORAL_TABLET | Freq: Every day | ORAL | Status: DC

## 2016-01-26 NOTE — Patient Instructions (Signed)
Hypertension Hypertension, commonly called high blood pressure, is when the force of blood pumping through your arteries is too strong. Your arteries are the blood vessels that carry blood from your heart throughout your body. A blood pressure reading consists of a higher number over a lower number, such as 110/72. The higher number (systolic) is the pressure inside your arteries when your heart pumps. The lower number (diastolic) is the pressure inside your arteries when your heart relaxes. Ideally you want your blood pressure below 120/80. Hypertension forces your heart to work harder to pump blood. Your arteries may become narrow or stiff. Having untreated or uncontrolled hypertension can cause heart attack, stroke, kidney disease, and other problems. RISK FACTORS Some risk factors for high blood pressure are controllable. Others are not.  Risk factors you cannot control include:   Race. You may be at higher risk if you are African American.  Age. Risk increases with age.  Gender. Men are at higher risk than women before age 45 years. After age 65, women are at higher risk than men. Risk factors you can control include:  Not getting enough exercise or physical activity.  Being overweight.  Getting too much fat, sugar, calories, or salt in your diet.  Drinking too much alcohol. SIGNS AND SYMPTOMS Hypertension does not usually cause signs or symptoms. Extremely high blood pressure (hypertensive crisis) may cause headache, anxiety, shortness of breath, and nosebleed. DIAGNOSIS To check if you have hypertension, your health care provider will measure your blood pressure while you are seated, with your arm held at the level of your heart. It should be measured at least twice using the same arm. Certain conditions can cause a difference in blood pressure between your right and left arms. A blood pressure reading that is higher than normal on one occasion does not mean that you need treatment. If  it is not clear whether you have high blood pressure, you may be asked to return on a different day to have your blood pressure checked again. Or, you may be asked to monitor your blood pressure at home for 1 or more weeks. TREATMENT Treating high blood pressure includes making lifestyle changes and possibly taking medicine. Living a healthy lifestyle can help lower high blood pressure. You may need to change some of your habits. Lifestyle changes may include:  Following the DASH diet. This diet is high in fruits, vegetables, and whole grains. It is low in salt, red meat, and added sugars.  Keep your sodium intake below 2,300 mg per day.  Getting at least 30-45 minutes of aerobic exercise at least 4 times per week.  Losing weight if necessary.  Not smoking.  Limiting alcoholic beverages.  Learning ways to reduce stress. Your health care provider may prescribe medicine if lifestyle changes are not enough to get your blood pressure under control, and if one of the following is true:  You are 18-59 years of age and your systolic blood pressure is above 140.  You are 60 years of age or older, and your systolic blood pressure is above 150.  Your diastolic blood pressure is above 90.  You have diabetes, and your systolic blood pressure is over 140 or your diastolic blood pressure is over 90.  You have kidney disease and your blood pressure is above 140/90.  You have heart disease and your blood pressure is above 140/90. Your personal target blood pressure may vary depending on your medical conditions, your age, and other factors. HOME CARE INSTRUCTIONS    Have your blood pressure rechecked as directed by your health care provider.   Take medicines only as directed by your health care provider. Follow the directions carefully. Blood pressure medicines must be taken as prescribed. The medicine does not work as well when you skip doses. Skipping doses also puts you at risk for  problems.  Do not smoke.   Monitor your blood pressure at home as directed by your health care provider. SEEK MEDICAL CARE IF:   You think you are having a reaction to medicines taken.  You have recurrent headaches or feel dizzy.  You have swelling in your ankles.  You have trouble with your vision. SEEK IMMEDIATE MEDICAL CARE IF:  You develop a severe headache or confusion.  You have unusual weakness, numbness, or feel faint.  You have severe chest or abdominal pain.  You vomit repeatedly.  You have trouble breathing. MAKE SURE YOU:   Understand these instructions.  Will watch your condition.  Will get help right away if you are not doing well or get worse.   This information is not intended to replace advice given to you by your health care provider. Make sure you discuss any questions you have with your health care provider.   Document Released: 11/15/2005 Document Revised: 04/01/2015 Document Reviewed: 09/07/2013 Elsevier Interactive Patient Education 2016 [employer]  

## 2016-01-26 NOTE — Progress Notes (Signed)
Pre visit review using our clinic review tool, if applicable. No additional management support is needed unless otherwise documented below in the visit note. 

## 2016-01-26 NOTE — Assessment & Plan Note (Signed)
Stable con't meds 

## 2016-01-26 NOTE — Progress Notes (Signed)
Patient ID: [name], female    DOB: [birth year]  Age: 45 y.o. MRN: [id number]    Subjective:  Subjective HPI [name] presents for f/u bp and anxiety.  No complaints.    Review of Systems  Constitutional: Negative for diaphoresis, appetite change, fatigue and unexpected weight change.  Eyes: Negative for pain, redness and visual disturbance.  Respiratory: Negative for cough, chest tightness, shortness of breath and wheezing.   Cardiovascular: Negative for chest pain, palpitations and leg swelling.  Endocrine: Negative for cold intolerance, heat intolerance, polydipsia, polyphagia and polyuria.  Genitourinary: Negative for dysuria, frequency and difficulty urinating.  Neurological: Negative for dizziness, light-headedness, numbness and headaches.    History Past Medical History  Diagnosis Date  . H/O endometritis   . Hypertension   . Anxiety     She has past surgical history that includes Abdominal hysterectomy (05/05/2011) and Cholecystectomy (02/2010).   Her family history includes Arthritis in her father; Cancer in her paternal grandmother and paternal uncle; Diabetes in her mother; Heart disease in her father and paternal grandmother; Hypertension in her father; Kidney disease in her mother.She reports that she quit smoking about 27 years ago. She has never used smokeless tobacco. She reports that she does not drink alcohol or use illicit drugs.  Current Outpatient Prescriptions on File Prior to Visit  Medication Sig Dispense Refill  . ALPRAZolam (XANAX XR) 0.5 MG 24 hr tablet 1 po tid prn 30 tablet 0   No current facility-administered medications on file prior to visit.     Objective:  Objective Physical Exam  Constitutional: She is oriented to person, place, and time. She appears well-developed and well-nourished.  HENT:  Head: Normocephalic and atraumatic.  Eyes: Conjunctivae and EOM are normal.  Neck: Normal range of motion. Neck supple. No JVD  present. Carotid bruit is not present. No thyromegaly present.  Cardiovascular: Normal rate, regular rhythm and normal heart sounds.   No murmur heard. Pulmonary/Chest: Effort normal and breath sounds normal. No respiratory distress. She has no wheezes. She has no rales. She exhibits no tenderness.  Musculoskeletal: She exhibits no edema.  Neurological: She is alert and oriented to person, place, and time.  Psychiatric: She has a normal mood and affect. Her behavior is normal. Judgment and thought content normal.  Nursing note and vitals reviewed.  BP 130/78 mmHg  Pulse 82  Temp(Src) 98.7 F (37.1 C) (Oral)  Ht 5' (1.524 m)  Wt 203 lb (92.08 kg)  BMI 39.65 kg/m2  SpO2 98% Wt Readings from Last 3 Encounters:  01/26/16 203 lb (92.08 kg)  02/18/15 189 lb 12.8 oz (86.093 kg)  01/29/15 186 lb (84.369 kg)     Lab Results  Component Value Date   WBC 8.9 02/08/2014   HGB 13.0 02/08/2014   HCT 39.0 02/08/2014   PLT 299.0 02/08/2014   GLUCOSE 103* 02/08/2014   CHOL 142 02/08/2014   TRIG 81.0 02/08/2014   HDL 48.20 02/08/2014   LDLCALC 78 02/08/2014   ALT 21 02/08/2014   AST 20 02/08/2014   NA 136 02/08/2014   K 3.9 02/08/2014   CL 103 02/08/2014   CREATININE 0.9 02/08/2014   BUN 18 02/08/2014   CO2 23 02/08/2014   TSH 2.06 08/31/2012   HGBA1C 5.3 08/31/2012   MICROALBUR 2.8* 08/31/2012    Dg Chest 2 View  11/25/2014  CLINICAL DATA:  Cough and congestion. EXAM: CHEST  2 VIEW COMPARISON:  02/19/2010. FINDINGS: Right middle lobe consolidation consistent with pneumonia  noted. Left lung is clear. Heart size normal. No pleural effusion or pneumothorax. No acute bony abnormality. IMPRESSION: Right middle lobe consolidation consistent with pneumonia. Electronically Signed   By: Marcello Moores  Register   On: 11/25/2014 15:41     Assessment & Plan:  Plan I have changed [name]'s lisinopril-hydrochlorothiazide and ALPRAZolam. I am also having her maintain her ALPRAZolam, meloxicam,  and citalopram.  Meds ordered this encounter  Medications  . meloxicam (MOBIC) 15 MG tablet    Sig: Take 1 tablet by mouth daily.    Refill:  0  . lisinopril-hydrochlorothiazide (PRINZIDE,ZESTORETIC) 20-25 MG tablet    Sig: Take 1 tablet by mouth daily.    Dispense:  90 tablet    Refill:  3  . citalopram (CELEXA) 40 MG tablet    Sig: Take 1 tablet (40 mg total) by mouth daily.    Dispense:  90 tablet    Refill:  3  . ALPRAZolam (XANAX) 0.5 MG tablet    Sig: Take 1 tablet (0.5 mg total) by mouth 3 (three) times daily as needed.    Dispense:  30 tablet    Refill:  0    Problem List Items Addressed This Visit      Unprioritized   HTN (hypertension) - Primary    Stable con't meds      Relevant Medications   lisinopril-hydrochlorothiazide (PRINZIDE,ZESTORETIC) 20-25 MG tablet   Other Relevant Orders   Lipid panel   POCT urinalysis dipstick (Completed)   CBC with Differential/Platelet   Anxiety and depression   Relevant Medications   citalopram (CELEXA) 40 MG tablet    Other Visit Diagnoses    Need for immunization against influenza        Relevant Orders    Flu Vaccine QUAD 36+ mos IM (Fluarix) (Completed)    Weight gain        Relevant Orders    Thyroid Panel With TSH       Follow-up: Return in about 6 months (around 07/25/2016), or if symptoms worsen or fail to improve, for annual exam, fasting.  Garnet Koyanagi, DO

## 2016-01-27 LAB — CBC WITH DIFFERENTIAL/PLATELET
Basophils Absolute: 0 10*3/uL (ref 0.0–0.1)
Basophils Relative: 0.5 % (ref 0.0–3.0)
EOS PCT: 2.5 % (ref 0.0–5.0)
Eosinophils Absolute: 0.2 10*3/uL (ref 0.0–0.7)
HCT: 35 % — ABNORMAL LOW (ref 36.0–46.0)
HEMOGLOBIN: 11.9 g/dL — AB (ref 12.0–15.0)
Lymphocytes Relative: 24.2 % (ref 12.0–46.0)
Lymphs Abs: 1.7 10*3/uL (ref 0.7–4.0)
MCHC: 34.1 g/dL (ref 30.0–36.0)
MCV: 85.2 fl (ref 78.0–100.0)
Monocytes Absolute: 0.3 10*3/uL (ref 0.1–1.0)
Monocytes Relative: 4.1 % (ref 3.0–12.0)
Neutro Abs: 4.8 10*3/uL (ref 1.4–7.7)
Neutrophils Relative %: 68.7 % (ref 43.0–77.0)
Platelets: 266 10*3/uL (ref 150.0–400.0)
RBC: 4.1 Mil/uL (ref 3.87–5.11)
RDW: 13.7 % (ref 11.5–15.5)
WBC: 6.9 10*3/uL (ref 4.0–10.5)

## 2016-01-27 LAB — LIPID PANEL
Cholesterol: 146 mg/dL (ref 0–200)
HDL: 53.8 mg/dL (ref >39.00)
LDL Cholesterol: 71 mg/dL (ref 0–99)
NONHDL: 91.81
Total CHOL/HDL Ratio: 3
Triglycerides: 106 mg/dL (ref 0.0–149.0)
VLDL: 21.2 mg/dL (ref 0.0–40.0)

## 2016-01-27 LAB — THYROID PANEL WITH TSH
Free Thyroxine Index: 2.2 (ref 1.4–3.8)
T3 Uptake: 26 % (ref 22–35)
T4 TOTAL: 8.6 ug/dL (ref 4.5–12.0)
TSH: 1.42 mIU/L

## 2016-02-25 ENCOUNTER — Telehealth: Payer: Self-pay | Admitting: Family Medicine

## 2016-02-25 NOTE — Telephone Encounter (Signed)
Pt dropped off paperwork to be fill out ([employer]-Employment Physical)

## 2016-02-25 NOTE — Telephone Encounter (Signed)
Received paperwork/SLS 03/29

## 2016-03-01 NOTE — Telephone Encounter (Signed)
Completed as much as possible; forwarded to provider/SLS 04/03

## 2016-03-04 ENCOUNTER — Other Ambulatory Visit: Payer: PRIVATE HEALTH INSURANCE

## 2016-04-23 ENCOUNTER — Ambulatory Visit (INDEPENDENT_AMBULATORY_CARE_PROVIDER_SITE_OTHER): Payer: PRIVATE HEALTH INSURANCE | Admitting: Family Medicine

## 2016-04-23 ENCOUNTER — Encounter: Payer: Self-pay | Admitting: Family Medicine

## 2016-04-23 VITALS — BP 108/70 | HR 74 | Temp 98.1°F | Wt 198.4 lb

## 2016-04-23 DIAGNOSIS — R2231 Localized swelling, mass and lump, right upper limb: Secondary | ICD-10-CM

## 2016-04-23 DIAGNOSIS — F411 Generalized anxiety disorder: Secondary | ICD-10-CM | POA: Diagnosis not present

## 2016-04-23 DIAGNOSIS — R21 Rash and other nonspecific skin eruption: Secondary | ICD-10-CM | POA: Diagnosis not present

## 2016-04-23 MED ORDER — METHYLPREDNISOLONE ACETATE 80 MG/ML IJ SUSP
80.0000 mg | Freq: Once | INTRAMUSCULAR | Status: AC
  Administered 2016-04-23: 80 mg via INTRAMUSCULAR

## 2016-04-23 MED ORDER — PREDNISONE 10 MG PO TABS: ORAL_TABLET | ORAL | Status: DC

## 2016-04-23 MED ORDER — ALPRAZOLAM 0.5 MG PO TABS: 0.5000 mg | ORAL_TABLET | Freq: Three times a day (TID) | ORAL | Status: DC | PRN

## 2016-04-23 NOTE — Patient Instructions (Signed)

## 2016-04-23 NOTE — Progress Notes (Signed)
Pre visit review using our clinic review tool, if applicable. No additional management support is needed unless otherwise documented below in the visit note. 

## 2016-04-28 ENCOUNTER — Telehealth: Payer: Self-pay

## 2016-04-28 DIAGNOSIS — R2231 Localized swelling, mass and lump, right upper limb: Secondary | ICD-10-CM

## 2016-04-28 NOTE — Telephone Encounter (Signed)
Message left to call the office.    KP 

## 2016-04-28 NOTE — Telephone Encounter (Signed)
-----   Message from Ann Held, DO sent at 04/28/2016  9:48 AM EDT ----- Pt was supposed to go to the lab when she was here last-- if lump in axilla is gone she does not need to but otherwise she should have it done

## 2016-04-28 NOTE — Progress Notes (Signed)
Patient ID: [name], female    DOB: Jul 29, 1971  Age: 45 y.o. MRN: [id number]    Subjective:  Subjective HPI [name] presents for rash on trunk, and arms.  Very itchy.  No new meds, soaps , detergents, etc.  No insect/ tick bites.   Otherwise pt feels fine.  She also feels a lump in her arm pit that she does not remember before.  Nontender.  No fevers, chills etc.    Review of Systems  Constitutional: Negative for diaphoresis, appetite change, fatigue and unexpected weight change.  Eyes: Negative for pain, redness and visual disturbance.  Respiratory: Negative for cough, chest tightness, shortness of breath and wheezing.   Cardiovascular: Negative for chest pain, palpitations and leg swelling.  Endocrine: Negative for cold intolerance, heat intolerance, polydipsia, polyphagia and polyuria.  Genitourinary: Negative for dysuria, frequency and difficulty urinating.  Skin: Positive for rash. Negative for color change.  Neurological: Negative for dizziness, light-headedness, numbness and headaches.    History Past Medical History  Diagnosis Date  . H/O endometritis   . Hypertension   . Anxiety     She has past surgical history that includes Abdominal hysterectomy (05/05/2011) and Cholecystectomy (02/2010).   Her family history includes Arthritis in her father; Cancer in her paternal grandmother and paternal uncle; Diabetes in her mother; Heart disease in her father and paternal grandmother; Hypertension in her father; Kidney disease in her mother.She reports that she quit smoking about 27 years ago. She has never used smokeless tobacco. She reports that she does not drink alcohol or use illicit drugs.  Current Outpatient Prescriptions on File Prior to Visit  Medication Sig Dispense Refill  . citalopram (CELEXA) 40 MG tablet Take 1 tablet (40 mg total) by mouth daily. 90 tablet 3  . lisinopril-hydrochlorothiazide (PRINZIDE,ZESTORETIC) 20-25 MG tablet Take 1 tablet by  mouth daily. 90 tablet 3   No current facility-administered medications on file prior to visit.     Objective:  Objective Physical Exam  Constitutional: She is oriented to person, place, and time. She appears well-developed and well-nourished.  HENT:  Head: Normocephalic and atraumatic.  Eyes: Conjunctivae and EOM are normal.  Neck: Normal range of motion. Neck supple. No JVD present. Carotid bruit is not present. No thyromegaly present.  Cardiovascular: Normal rate, regular rhythm and normal heart sounds.   No murmur heard. Pulmonary/Chest: Effort normal and breath sounds normal. No respiratory distress. She has no wheezes. She has no rales. She exhibits no tenderness.  Musculoskeletal: She exhibits no edema.  Lymphadenopathy:    She has axillary adenopathy.       Right axillary: Lateral adenopathy present.  Neurological: She is alert and oriented to person, place, and time.  Skin: Rash noted. There is erythema.     Psychiatric: She has a normal mood and affect.  Nursing note and vitals reviewed.  BP 108/70 mmHg  Pulse 74  Temp(Src) 98.1 F (36.7 C) (Oral)  Wt 198 lb 6.4 oz (89.994 kg)  SpO2 98% Wt Readings from Last 3 Encounters:  04/23/16 198 lb 6.4 oz (89.994 kg)  01/26/16 203 lb (92.08 kg)  02/18/15 189 lb 12.8 oz (86.093 kg)     Lab Results  Component Value Date   WBC 6.9 01/26/2016   HGB 11.9* 01/26/2016   HCT 35.0* 01/26/2016   PLT 266.0 01/26/2016   GLUCOSE 103* 02/08/2014   CHOL 146 01/26/2016   TRIG 106.0 01/26/2016   HDL 53.80 01/26/2016   LDLCALC 71 01/26/2016  ALT 21 02/08/2014   AST 20 02/08/2014   NA 136 02/08/2014   K 3.9 02/08/2014   CL 103 02/08/2014   CREATININE 0.9 02/08/2014   BUN 18 02/08/2014   CO2 23 02/08/2014   TSH 1.42 01/26/2016   HGBA1C 5.3 08/31/2012   MICROALBUR 2.8* 08/31/2012    Dg Chest 2 View  11/25/2014  CLINICAL DATA:  Cough and congestion. EXAM: CHEST  2 VIEW COMPARISON:  02/19/2010. FINDINGS: Right middle lobe  consolidation consistent with pneumonia noted. Left lung is clear. Heart size normal. No pleural effusion or pneumothorax. No acute bony abnormality. IMPRESSION: Right middle lobe consolidation consistent with pneumonia. Electronically Signed   By: Marcello Moores  Register   On: 11/25/2014 15:41     Assessment & Plan:  Plan I have discontinued Ms. [name]'s meloxicam. I am also having her start on predniSONE. Additionally, I am having her maintain her lisinopril-hydrochlorothiazide, citalopram, and ALPRAZolam. We administered methylPREDNISolone acetate.  Meds ordered this encounter  Medications  . ALPRAZolam (XANAX) 0.5 MG tablet    Sig: Take 1 tablet (0.5 mg total) by mouth 3 (three) times daily as needed.    Dispense:  60 tablet    Refill:  1  . predniSONE (DELTASONE) 10 MG tablet    Sig: 3 po qd for 3 days then 2 po qd for 3 days the 1 po qd for 3 days    Dispense:  18 tablet    Refill:  0  . methylPREDNISolone acetate (DEPO-MEDROL) injection 80 mg    Sig:     Problem List Items Addressed This Visit    None    Visit Diagnoses    Axillary mass, right    -  Primary    Relevant Orders    CBC with Differential/Platelet    Generalized anxiety disorder        Relevant Medications    ALPRAZolam (XANAX) 0.5 MG tablet    Rash and nonspecific skin eruption        Relevant Medications    predniSONE (DELTASONE) 10 MG tablet    methylPREDNISolone acetate (DEPO-MEDROL) injection 80 mg (Completed)       Follow-up: Return if symptoms worsen or fail to improve.  Ann Held, DO

## 2016-05-12 NOTE — Telephone Encounter (Signed)
Need diagnostic mammogram   mmass R axilla

## 2016-05-12 NOTE — Telephone Encounter (Signed)
Patient said during the visit the concerned was dismissed and you said it could just be fatty tissue, the patient will come back for the CBC but wants a mammogram as well. Please advise    KP

## 2016-05-13 NOTE — Telephone Encounter (Signed)
Order placed. Left detailed message on pt's cell # and to call if any questions.

## 2016-05-17 ENCOUNTER — Other Ambulatory Visit: Payer: PRIVATE HEALTH INSURANCE

## 2016-05-21 ENCOUNTER — Encounter: Payer: PRIVATE HEALTH INSURANCE | Admitting: Family Medicine

## 2016-06-08 ENCOUNTER — Encounter: Payer: Self-pay | Admitting: Family Medicine

## 2016-06-08 ENCOUNTER — Ambulatory Visit (INDEPENDENT_AMBULATORY_CARE_PROVIDER_SITE_OTHER): Payer: Self-pay | Admitting: Family Medicine

## 2016-06-08 VITALS — BP 105/69 | HR 74 | Temp 98.4°F | Ht 61.5 in | Wt 201.6 lb

## 2016-06-08 DIAGNOSIS — R21 Rash and other nonspecific skin eruption: Secondary | ICD-10-CM

## 2016-06-08 DIAGNOSIS — L02411 Cutaneous abscess of right axilla: Secondary | ICD-10-CM

## 2016-06-08 DIAGNOSIS — N6452 Nipple discharge: Secondary | ICD-10-CM

## 2016-06-08 MED ORDER — PERMETHRIN 5 % EX CREA: 1.0000 "application " | TOPICAL_CREAM | Freq: Once | CUTANEOUS | Status: DC

## 2016-06-08 MED ORDER — AMOXICILLIN-POT CLAVULANATE 875-125 MG PO TABS
1.0000 | ORAL_TABLET | Freq: Two times a day (BID) | ORAL | Status: DC
Start: 2016-06-08 — End: 2016-10-13

## 2016-06-08 NOTE — Progress Notes (Signed)
Pre visit review using our clinic review tool, if applicable. No additional management support is needed unless otherwise documented below in the visit note. 

## 2016-06-08 NOTE — Progress Notes (Signed)
Subjective:     [name] is a 45 y.o. female and is here for a comprehensive physical exam. The patient reports problems - b/l breast d/c and pain in R axilla , pt also still c/o rash all over-- see last ov --- pred did not work.  Social History   Social History  . Marital Status: Married    Spouse Name: N/A  . Number of Children: N/A  . Years of Education: N/A   Occupational History  . teacher     [employer]   Social History Main Topics  . Smoking status: Former Smoker    Quit date: 11/29/1988  . Smokeless tobacco: Never Used  . Alcohol Use: No  . Drug Use: No  . Sexual Activity:    Partners: Male   Other Topics Concern  . Not on file   Social History Narrative   Health Maintenance  Topic Date Due  . HIV Screening  06/08/2017 (Originally 08/22/1985)  . INFLUENZA VACCINE  06/29/2016  . TETANUS/TDAP  07/06/2017    The following portions of the patient's history were reviewed and updated as appropriate:  She  has a past medical history of H/O endometritis; Hypertension; and Anxiety. She  does not have any pertinent problems on file. She  has past surgical history that includes Abdominal hysterectomy (05/05/2011) and Cholecystectomy (02/2010). Her family history includes Arthritis in her father; Cancer in her paternal grandmother and paternal uncle; Diabetes in her mother; Heart disease in her father and paternal grandmother; Hypertension in her father; Kidney disease in her mother. She  reports that she quit smoking about 27 years ago. She has never used smokeless tobacco. She reports that she does not drink alcohol or use illicit drugs. She has a current medication list which includes the following prescription(s): alprazolam, citalopram, lisinopril-hydrochlorothiazide, amoxicillin-clavulanate, and permethrin. Current Outpatient Prescriptions on File Prior to Visit  Medication Sig Dispense Refill  . ALPRAZolam (XANAX) 0.5 MG tablet Take 1 tablet (0.5 mg total)  by mouth 3 (three) times daily as needed. 60 tablet 1  . citalopram (CELEXA) 40 MG tablet Take 1 tablet (40 mg total) by mouth daily. 90 tablet 3  . lisinopril-hydrochlorothiazide (PRINZIDE,ZESTORETIC) 20-25 MG tablet Take 1 tablet by mouth daily. 90 tablet 3   No current facility-administered medications on file prior to visit.   She has No Known Allergies..  Review of Systems Review of Systems  Constitutional: Negative for activity change, appetite change and fatigue.  HENT: Negative for hearing loss, congestion, tinnitus and ear discharge.  dentist q58m Eyes: Negative for visual disturbance (see optho q1y -- vision corrected to 20/20 with glasses).  Respiratory: Negative for cough, chest tightness and shortness of breath.   Cardiovascular: Negative for chest pain, palpitations and leg swelling.  Gastrointestinal: Negative for abdominal pain, diarrhea, constipation and abdominal distention.  Genitourinary: Negative for urgency, frequency, decreased urine volume and difficulty urinating.  Musculoskeletal: Negative for back pain, arthralgias and gait problem.  Skin: Negative for color change, pallor and rash.  Neurological: Negative for dizziness, light-headedness, numbness and headaches.  Hematological: Negative for adenopathy. Does not bruise/bleed easily.  Psychiatric/Behavioral: Negative for suicidal ideas, confusion, sleep disturbance, self-injury, dysphoric mood, decreased concentration and agitation.       Objective:    BP 105/69 mmHg  Pulse 74  Temp(Src) 98.4 F (36.9 C) (Oral)  Ht 5' 1.5" (1.562 m)  Wt 201 lb 9.6 oz (91.445 kg)  BMI 37.48 kg/m2  SpO2 98% General appearance: alert, cooperative, appears stated  age and no distress Head: Normocephalic, without obvious abnormality, atraumatic Eyes: conjunctivae/corneas clear. PERRL, EOM's intact. Fundi benign. Ears: normal TM's and external ear canals both ears Nose: Nares normal. Septum midline. Mucosa normal. No drainage  or sinus tenderness. Throat: + cavity in front upper teeth Neck: no adenopathy, no carotid bruit, no JVD, supple, symmetrical, trachea midline and thyroid not enlarged, symmetric, no tenderness/mass/nodules Back: symmetric, no curvature. ROM normal. No CVA tenderness. Lungs: clear to auscultation bilaterally Breasts: normal appearance, no masses or tenderness, No nipple discharge or bleeding, positive findings: + mass in R axilla -- tender-- ? abscess Heart: regular rate and rhythm, S1, S2 normal, no murmur, click, rub or gallop Abdomen: soft, non-tender; bowel sounds normal; no masses,  no organomegaly Pelvic: not indicated; status post hysterectomy, negative ROS Extremities: extremities normal, atraumatic, no cyanosis or edema Pulses: 2+ and symmetric Skin: Skin color, texture, turgor normal. No rashes or lesions Lymph nodes: Axillary adenopathy: R Neurologic: Alert and oriented X 3, normal strength and tone. Normal symmetric reflexes. Normal coordination and gait    Assessment:    Healthy female exam.      Plan:    ghm utd Check labs See After Visit Summary for Counseling Recommendations

## 2016-06-08 NOTE — Patient Instructions (Signed)
Preventive Care for Adults, Female A healthy lifestyle and preventive care can promote health and wellness. Preventive health guidelines for women include the following key practices.  A routine yearly physical is a good way to check with your health care provider about your health and preventive screening. It is a chance to share any concerns and updates on your health and to receive a thorough exam.  Visit your dentist for a routine exam and preventive care every 6 months. Brush your teeth twice a day and floss once a day. Good oral hygiene prevents tooth decay and gum disease.  The frequency of eye exams is based on your age, health, family medical history, use of contact lenses, and other factors. Follow your health care provider's recommendations for frequency of eye exams.  Eat a healthy diet. Foods like vegetables, fruits, whole grains, low-fat dairy products, and lean protein foods contain the nutrients you need without too many calories. Decrease your intake of foods high in solid fats, added sugars, and salt. Eat the right amount of calories for you.Get information about a proper diet from your health care provider, if necessary.  Regular physical exercise is one of the most important things you can do for your health. Most adults should get at least 150 minutes of moderate-intensity exercise (any activity that increases your heart rate and causes you to sweat) each week. In addition, most adults need muscle-strengthening exercises on 2 or more days a week.  Maintain a healthy weight. The body mass index (BMI) is a screening tool to identify possible weight problems. It provides an estimate of body fat based on height and weight. Your health care provider can find your BMI and can help you achieve or maintain a healthy weight.For adults 20 years and older:  A BMI below 18.5 is considered underweight.  A BMI of 18.5 to 24.9 is normal.  A BMI of 25 to 29.9 is considered overweight.  A  BMI of 30 and above is considered obese.  Maintain normal blood lipids and cholesterol levels by exercising and minimizing your intake of saturated fat. Eat a balanced diet with plenty of fruit and vegetables. Blood tests for lipids and cholesterol should begin at age 45 and be repeated every 5 years. If your lipid or cholesterol levels are high, you are over 50, or you are at high risk for heart disease, you may need your cholesterol levels checked more frequently.Ongoing high lipid and cholesterol levels should be treated with medicines if diet and exercise are not working.  If you smoke, find out from your health care provider how to quit. If you do not use tobacco, do not start.  Lung cancer screening is recommended for adults aged 45-80 years who are at high risk for developing lung cancer because of a history of smoking. A yearly low-dose CT scan of the lungs is recommended for people who have at least a 30-pack-year history of smoking and are a current smoker or have quit within the past 15 years. A pack year of smoking is smoking an average of 1 pack of cigarettes a day for 1 year (for example: 1 pack a day for 30 years or 2 packs a day for 15 years). Yearly screening should continue until the smoker has stopped smoking for at least 15 years. Yearly screening should be stopped for people who develop a health problem that would prevent them from having lung cancer treatment.  If you are pregnant, do not drink alcohol. If you are  breastfeeding, be very cautious about drinking alcohol. If you are not pregnant and choose to drink alcohol, do not have more than 1 drink per day. One drink is considered to be 12 ounces (355 mL) of beer, 5 ounces (148 mL) of wine, or 1.5 ounces (44 mL) of liquor.  Avoid use of street drugs. Do not share needles with anyone. Ask for help if you need support or instructions about stopping the use of drugs.  High blood pressure causes heart disease and increases the risk  of stroke. Your blood pressure should be checked at least every 1 to 2 years. Ongoing high blood pressure should be treated with medicines if weight loss and exercise do not work.  If you are 55-79 years old, ask your health care provider if you should take aspirin to prevent strokes.  Diabetes screening is done by taking a blood sample to check your blood glucose level after you have not eaten for a certain period of time (fasting). If you are not overweight and you do not have risk factors for diabetes, you should be screened once every 3 years starting at age 45. If you are overweight or obese and you are 40-70 years of age, you should be screened for diabetes every year as part of your cardiovascular risk assessment.  Breast cancer screening is essential preventive care for women. You should practice "breast self-awareness." This means understanding the normal appearance and feel of your breasts and may include breast self-examination. Any changes detected, no matter how small, should be reported to a health care provider. Women in their 20s and 30s should have a clinical breast exam (CBE) by a health care provider as part of a regular health exam every 1 to 3 years. After age 40, women should have a CBE every year. Starting at age 40, women should consider having a mammogram (breast X-ray test) every year. Women who have a family history of breast cancer should talk to their health care provider about genetic screening. Women at a high risk of breast cancer should talk to their health care providers about having an MRI and a mammogram every year.  Breast cancer gene (BRCA)-related cancer risk assessment is recommended for women who have family members with BRCA-related cancers. BRCA-related cancers include breast, ovarian, tubal, and peritoneal cancers. Having family members with these cancers may be associated with an increased risk for harmful changes (mutations) in the breast cancer genes BRCA1 and  BRCA2. Results of the assessment will determine the need for genetic counseling and BRCA1 and BRCA2 testing.  Your health care provider may recommend that you be screened regularly for cancer of the pelvic organs (ovaries, uterus, and vagina). This screening involves a pelvic examination, including checking for microscopic changes to the surface of your cervix (Pap test). You may be encouraged to have this screening done every 3 years, beginning at age 21.  For women ages 30-65, health care providers may recommend pelvic exams and Pap testing every 3 years, or they may recommend the Pap and pelvic exam, combined with testing for human papilloma virus (HPV), every 5 years. Some types of HPV increase your risk of cervical cancer. Testing for HPV may also be done on women of any age with unclear Pap test results.  Other health care providers may not recommend any screening for nonpregnant women who are considered low risk for pelvic cancer and who do not have symptoms. Ask your health care provider if a screening pelvic exam is right for   you.  If you have had past treatment for cervical cancer or a condition that could lead to cancer, you need Pap tests and screening for cancer for at least 20 years after your treatment. If Pap tests have been discontinued, your risk factors (such as having a new sexual partner) need to be reassessed to determine if screening should resume. Some women have medical problems that increase the chance of getting cervical cancer. In these cases, your health care provider may recommend more frequent screening and Pap tests.  Colorectal cancer can be detected and often prevented. Most routine colorectal cancer screening begins at the age of 50 years and continues through age 75 years. However, your health care provider may recommend screening at an earlier age if you have risk factors for colon cancer. On a yearly basis, your health care provider may provide home test kits to check  for hidden blood in the stool. Use of a small camera at the end of a tube, to directly examine the colon (sigmoidoscopy or colonoscopy), can detect the earliest forms of colorectal cancer. Talk to your health care provider about this at age 50, when routine screening begins. Direct exam of the colon should be repeated every 5-10 years through age 75 years, unless early forms of precancerous polyps or small growths are found.  People who are at an increased risk for hepatitis B should be screened for this virus. You are considered at high risk for hepatitis B if:  You were born in a country where hepatitis B occurs often. Talk with your health care provider about which countries are considered high risk.  Your parents were born in a high-risk country and you have not received a shot to protect against hepatitis B (hepatitis B vaccine).  You have HIV or AIDS.  You use needles to inject street drugs.  You live with, or have sex with, someone who has hepatitis B.  You get hemodialysis treatment.  You take certain medicines for conditions like cancer, organ transplantation, and autoimmune conditions.  Hepatitis C blood testing is recommended for all people born from 1945 through 1965 and any individual with known risks for hepatitis C.  Practice safe sex. Use condoms and avoid high-risk sexual practices to reduce the spread of sexually transmitted infections (STIs). STIs include gonorrhea, chlamydia, syphilis, trichomonas, herpes, HPV, and human immunodeficiency virus (HIV). Herpes, HIV, and HPV are viral illnesses that have no cure. They can result in disability, cancer, and death.  You should be screened for sexually transmitted illnesses (STIs) including gonorrhea and chlamydia if:  You are sexually active and are younger than 24 years.  You are older than 24 years and your health care provider tells you that you are at risk for this type of infection.  Your sexual activity has changed  since you were last screened and you are at an increased risk for chlamydia or gonorrhea. Ask your health care provider if you are at risk.  If you are at risk of being infected with HIV, it is recommended that you take a prescription medicine daily to prevent HIV infection. This is called preexposure prophylaxis (PrEP). You are considered at risk if:  You are sexually active and do not regularly use condoms or know the HIV status of your partner(s).  You take drugs by injection.  You are sexually active with a partner who has HIV.  Talk with your health care provider about whether you are at high risk of being infected with HIV. If   you choose to begin PrEP, you should first be tested for HIV. You should then be tested every 3 months for as long as you are taking PrEP.  Osteoporosis is a disease in which the bones lose minerals and strength with aging. This can result in serious bone fractures or breaks. The risk of osteoporosis can be identified using a bone density scan. Women ages 67 years and over and women at risk for fractures or osteoporosis should discuss screening with their health care providers. Ask your health care provider whether you should take a calcium supplement or vitamin D to reduce the rate of osteoporosis.  Menopause can be associated with physical symptoms and risks. Hormone replacement therapy is available to decrease symptoms and risks. You should talk to your health care provider about whether hormone replacement therapy is right for you.  Use sunscreen. Apply sunscreen liberally and repeatedly throughout the day. You should seek shade when your shadow is shorter than you. Protect yourself by wearing long sleeves, pants, a wide-brimmed hat, and sunglasses year round, whenever you are outdoors.  Once a month, do a whole body skin exam, using a mirror to look at the skin on your back. Tell your health care provider of new moles, moles that have irregular borders, moles that  are larger than a pencil eraser, or moles that have changed in shape or color.  Stay current with required vaccines (immunizations).  Influenza vaccine. All adults should be immunized every year.  Tetanus, diphtheria, and acellular pertussis (Td, Tdap) vaccine. Pregnant women should receive 1 dose of Tdap vaccine during each pregnancy. The dose should be obtained regardless of the length of time since the last dose. Immunization is preferred during the 27th-36th week of gestation. An adult who has not previously received Tdap or who does not know her vaccine status should receive 1 dose of Tdap. This initial dose should be followed by tetanus and diphtheria toxoids (Td) booster doses every 10 years. Adults with an unknown or incomplete history of completing a 3-dose immunization series with Td-containing vaccines should begin or complete a primary immunization series including a Tdap dose. Adults should receive a Td booster every 10 years.  Varicella vaccine. An adult without evidence of immunity to varicella should receive 2 doses or a second dose if she has previously received 1 dose. Pregnant females who do not have evidence of immunity should receive the first dose after pregnancy. This first dose should be obtained before leaving the health care facility. The second dose should be obtained 4-8 weeks after the first dose.  Human papillomavirus (HPV) vaccine. Females aged 13-26 years who have not received the vaccine previously should obtain the 3-dose series. The vaccine is not recommended for use in pregnant females. However, pregnancy testing is not needed before receiving a dose. If a female is found to be pregnant after receiving a dose, no treatment is needed. In that case, the remaining doses should be delayed until after the pregnancy. Immunization is recommended for any person with an immunocompromised condition through the age of 61 years if she did not get any or all doses earlier. During the  3-dose series, the second dose should be obtained 4-8 weeks after the first dose. The third dose should be obtained 24 weeks after the first dose and 16 weeks after the second dose.  Zoster vaccine. One dose is recommended for adults aged 30 years or older unless certain conditions are present.  Measles, mumps, and rubella (MMR) vaccine. Adults born  before 1957 generally are considered immune to measles and mumps. Adults born in 1957 or later should have 1 or more doses of MMR vaccine unless there is a contraindication to the vaccine or there is laboratory evidence of immunity to each of the three diseases. A routine second dose of MMR vaccine should be obtained at least 28 days after the first dose for students attending postsecondary schools, health care workers, or international travelers. People who received inactivated measles vaccine or an unknown type of measles vaccine during 1963-1967 should receive 2 doses of MMR vaccine. People who received inactivated mumps vaccine or an unknown type of mumps vaccine before 1979 and are at high risk for mumps infection should consider immunization with 2 doses of MMR vaccine. For females of childbearing age, rubella immunity should be determined. If there is no evidence of immunity, females who are not pregnant should be vaccinated. If there is no evidence of immunity, females who are pregnant should delay immunization until after pregnancy. Unvaccinated health care workers born before 1957 who lack laboratory evidence of measles, mumps, or rubella immunity or laboratory confirmation of disease should consider measles and mumps immunization with 2 doses of MMR vaccine or rubella immunization with 1 dose of MMR vaccine.  Pneumococcal 13-valent conjugate (PCV13) vaccine. When indicated, a person who is uncertain of his immunization history and has no record of immunization should receive the PCV13 vaccine. All adults 65 years of age and older should receive this  vaccine. An adult aged 19 years or older who has certain medical conditions and has not been previously immunized should receive 1 dose of PCV13 vaccine. This PCV13 should be followed with a dose of pneumococcal polysaccharide (PPSV23) vaccine. Adults who are at high risk for pneumococcal disease should obtain the PPSV23 vaccine at least 8 weeks after the dose of PCV13 vaccine. Adults older than 46 years of age who have normal immune system function should obtain the PPSV23 vaccine dose at least 1 year after the dose of PCV13 vaccine.  Pneumococcal polysaccharide (PPSV23) vaccine. When PCV13 is also indicated, PCV13 should be obtained first. All adults aged 65 years and older should be immunized. An adult younger than age 65 years who has certain medical conditions should be immunized. Any person who resides in a nursing home or long-term care facility should be immunized. An adult smoker should be immunized. People with an immunocompromised condition and certain other conditions should receive both PCV13 and PPSV23 vaccines. People with human immunodeficiency virus (HIV) infection should be immunized as soon as possible after diagnosis. Immunization during chemotherapy or radiation therapy should be avoided. Routine use of PPSV23 vaccine is not recommended for American Indians, [place] Natives, or people younger than 65 years unless there are medical conditions that require PPSV23 vaccine. When indicated, people who have unknown immunization and have no record of immunization should receive PPSV23 vaccine. One-time revaccination 5 years after the first dose of PPSV23 is recommended for people aged 19-64 years who have chronic kidney failure, nephrotic syndrome, asplenia, or immunocompromised conditions. People who received 1-2 doses of PPSV23 before age 65 years should receive another dose of PPSV23 vaccine at age 65 years or later if at least 5 years have passed since the previous dose. Doses of PPSV23 are not  needed for people immunized with PPSV23 at or after age 65 years.  Meningococcal vaccine. Adults with asplenia or persistent complement component deficiencies should receive 2 doses of quadrivalent meningococcal conjugate (MenACWY-D) vaccine. The doses should be obtained   at least 2 months apart. Microbiologists working with certain meningococcal bacteria, [place] recruits, people at risk during an outbreak, and people who travel to or live in countries with a high rate of meningitis should be immunized. A first-year college student up through age 34 years who is living in a residence hall should receive a dose if she did not receive a dose on or after her 16th birthday. Adults who have certain high-risk conditions should receive one or more doses of vaccine.  Hepatitis A vaccine. Adults who wish to be protected from this disease, have certain high-risk conditions, work with hepatitis A-infected animals, work in hepatitis A research labs, or travel to or work in countries with a high rate of hepatitis A should be immunized. Adults who were previously unvaccinated and who anticipate close contact with an international adoptee during the first 60 days after arrival in the [place] from a country with a high rate of hepatitis A should be immunized.  Hepatitis B vaccine. Adults who wish to be protected from this disease, have certain high-risk conditions, may be exposed to blood or other infectious body fluids, are household contacts or sex partners of hepatitis B positive people, are clients or workers in certain care facilities, or travel to or work in countries with a high rate of hepatitis B should be immunized.  Haemophilus influenzae type b (Hib) vaccine. A previously unvaccinated person with asplenia or sickle cell disease or having a scheduled splenectomy should receive 1 dose of Hib vaccine. Regardless of previous immunization, a recipient of a hematopoietic stem cell transplant should receive a  3-dose series 6-12 months after her successful transplant. Hib vaccine is not recommended for adults with HIV infection. Preventive Services / Frequency Ages 35 to 4 years  Blood pressure check.** / Every 3-5 years.  Lipid and cholesterol check.** / Every 5 years beginning at age 60.  Clinical breast exam.** / Every 3 years for women in their 71s and 10s.  BRCA-related cancer risk assessment.** / For women who have family members with a BRCA-related cancer (breast, ovarian, tubal, or peritoneal cancers).  Pap test.** / Every 2 years from ages 76 through 26. Every 3 years starting at age 61 through age 76 or 93 with a history of 3 consecutive normal Pap tests.  HPV screening.** / Every 3 years from ages 37 through ages 60 to 51 with a history of 3 consecutive normal Pap tests.  Hepatitis C blood test.** / For any individual with known risks for hepatitis C.  Skin self-exam. / Monthly.  Influenza vaccine. / Every year.  Tetanus, diphtheria, and acellular pertussis (Tdap, Td) vaccine.** / Consult your health care provider. Pregnant women should receive 1 dose of Tdap vaccine during each pregnancy. 1 dose of Td every 10 years.  Varicella vaccine.** / Consult your health care provider. Pregnant females who do not have evidence of immunity should receive the first dose after pregnancy.  HPV vaccine. / 3 doses over 6 months, if 93 and younger. The vaccine is not recommended for use in pregnant females. However, pregnancy testing is not needed before receiving a dose.  Measles, mumps, rubella (MMR) vaccine.** / You need at least 1 dose of MMR if you were born in 1957 or later. You may also need a 2nd dose. For females of childbearing age, rubella immunity should be determined. If there is no evidence of immunity, females who are not pregnant should be vaccinated. If there is no evidence of immunity, females who are  pregnant should delay immunization until after pregnancy.  Pneumococcal  13-valent conjugate (PCV13) vaccine.** / Consult your health care provider.  Pneumococcal polysaccharide (PPSV23) vaccine.** / 1 to 2 doses if you smoke cigarettes or if you have certain conditions.  Meningococcal vaccine.** / 1 dose if you are age 68 to 8 years and a Market researcher living in a residence hall, or have one of several medical conditions, you need to get vaccinated against meningococcal disease. You may also need additional booster doses.  Hepatitis A vaccine.** / Consult your health care provider.  Hepatitis B vaccine.** / Consult your health care provider.  Haemophilus influenzae type b (Hib) vaccine.** / Consult your health care provider. Ages 7 to 53 years  Blood pressure check.** / Every year.  Lipid and cholesterol check.** / Every 5 years beginning at age 25 years.  Lung cancer screening. / Every year if you are aged 11-80 years and have a 30-pack-year history of smoking and currently smoke or have quit within the past 15 years. Yearly screening is stopped once you have quit smoking for at least 15 years or develop a health problem that would prevent you from having lung cancer treatment.  Clinical breast exam.** / Every year after age 48 years.  BRCA-related cancer risk assessment.** / For women who have family members with a BRCA-related cancer (breast, ovarian, tubal, or peritoneal cancers).  Mammogram.** / Every year beginning at age 41 years and continuing for as long as you are in good health. Consult with your health care provider.  Pap test.** / Every 3 years starting at age 65 years through age 37 or 70 years with a history of 3 consecutive normal Pap tests.  HPV screening.** / Every 3 years from ages 72 years through ages 60 to 40 years with a history of 3 consecutive normal Pap tests.  Fecal occult blood test (FOBT) of stool. / Every year beginning at age 21 years and continuing until age 5 years. You may not need to do this test if you get  a colonoscopy every 10 years.  Flexible sigmoidoscopy or colonoscopy.** / Every 5 years for a flexible sigmoidoscopy or every 10 years for a colonoscopy beginning at age 35 years and continuing until age 48 years.  Hepatitis C blood test.** / For all people born from [birth year] through 1965 and any individual with known risks for hepatitis C.  Skin self-exam. / Monthly.  Influenza vaccine. / Every year.  Tetanus, diphtheria, and acellular pertussis (Tdap/Td) vaccine.** / Consult your health care provider. Pregnant women should receive 1 dose of Tdap vaccine during each pregnancy. 1 dose of Td every 10 years.  Varicella vaccine.** / Consult your health care provider. Pregnant females who do not have evidence of immunity should receive the first dose after pregnancy.  Zoster vaccine.** / 1 dose for adults aged 30 years or older.  Measles, mumps, rubella (MMR) vaccine.** / You need at least 1 dose of MMR if you were born in 1957 or later. You may also need a second dose. For females of childbearing age, rubella immunity should be determined. If there is no evidence of immunity, females who are not pregnant should be vaccinated. If there is no evidence of immunity, females who are pregnant should delay immunization until after pregnancy.  Pneumococcal 13-valent conjugate (PCV13) vaccine.** / Consult your health care provider.  Pneumococcal polysaccharide (PPSV23) vaccine.** / 1 to 2 doses if you smoke cigarettes or if you have certain conditions.  Meningococcal vaccine.** /  Consult your health care provider.  Hepatitis A vaccine.** / Consult your health care provider.  Hepatitis B vaccine.** / Consult your health care provider.  Haemophilus influenzae type b (Hib) vaccine.** / Consult your health care provider. Ages 64 years and over  Blood pressure check.** / Every year.  Lipid and cholesterol check.** / Every 5 years beginning at age 23 years.  Lung cancer screening. / Every year if you  are aged 16-80 years and have a 30-pack-year history of smoking and currently smoke or have quit within the past 15 years. Yearly screening is stopped once you have quit smoking for at least 15 years or develop a health problem that would prevent you from having lung cancer treatment.  Clinical breast exam.** / Every year after age 74 years.  BRCA-related cancer risk assessment.** / For women who have family members with a BRCA-related cancer (breast, ovarian, tubal, or peritoneal cancers).  Mammogram.** / Every year beginning at age 44 years and continuing for as long as you are in good health. Consult with your health care provider.  Pap test.** / Every 3 years starting at age 58 years through age 22 or 39 years with 3 consecutive normal Pap tests. Testing can be stopped between 65 and 70 years with 3 consecutive normal Pap tests and no abnormal Pap or HPV tests in the past 10 years.  HPV screening.** / Every 3 years from ages 64 years through ages 70 or 61 years with a history of 3 consecutive normal Pap tests. Testing can be stopped between 65 and 70 years with 3 consecutive normal Pap tests and no abnormal Pap or HPV tests in the past 10 years.  Fecal occult blood test (FOBT) of stool. / Every year beginning at age 40 years and continuing until age 27 years. You may not need to do this test if you get a colonoscopy every 10 years.  Flexible sigmoidoscopy or colonoscopy.** / Every 5 years for a flexible sigmoidoscopy or every 10 years for a colonoscopy beginning at age 7 years and continuing until age 32 years.  Hepatitis C blood test.** / For all people born from [birth year] through 1966 and any individual with known risks for hepatitis C.  Osteoporosis screening.** / A one-time screening for women ages 30 years and over and women at risk for fractures or osteoporosis.  Skin self-exam. / Monthly.  Influenza vaccine. / Every year.  Tetanus, diphtheria, and acellular pertussis (Tdap/Td)  vaccine.** / 1 dose of Td every 10 years.  Varicella vaccine.** / Consult your health care provider.  Zoster vaccine.** / 1 dose for adults aged 35 years or older.  Pneumococcal 13-valent conjugate (PCV13) vaccine.** / Consult your health care provider.  Pneumococcal polysaccharide (PPSV23) vaccine.** / 1 dose for all adults aged 46 years and older.  Meningococcal vaccine.** / Consult your health care provider.  Hepatitis A vaccine.** / Consult your health care provider.  Hepatitis B vaccine.** / Consult your health care provider.  Haemophilus influenzae type b (Hib) vaccine.** / Consult your health care provider. ** Family history and personal history of risk and conditions may change your health care provider's recommendations.   This information is not intended to replace advice given to you by your health care provider. Make sure you discuss any questions you have with your health care provider.   Document Released: 01/11/2002 Document Revised: 12/06/2014 Document Reviewed: 04/12/2011 Elsevier Interactive Patient Education [employer].

## 2016-06-10 ENCOUNTER — Telehealth: Payer: Self-pay | Admitting: Family Medicine

## 2016-06-10 NOTE — Telephone Encounter (Signed)
Pt called in to speak with CMA, she says that she has a few questions about her medications.    CB: [phone] OR [phone]

## 2016-06-11 NOTE — Telephone Encounter (Signed)
Patient with scabies and she wanted to make sure she is supposed to be itching even though she used the medication. I made her aware yes, that was normal and it may take up to 7 days to see improvement. She verbalized understanding, she wanted to know what the Amoxicillin was for and I advised for the abscess she said she would go back and pick it up.     KP

## 2016-06-11 NOTE — Telephone Encounter (Signed)
Patient returning your call.

## 2016-06-11 NOTE — Telephone Encounter (Signed)
Message left to call the office.    KP 

## 2016-06-19 ENCOUNTER — Other Ambulatory Visit: Payer: Self-pay | Admitting: Family Medicine

## 2016-06-19 DIAGNOSIS — N6452 Nipple discharge: Secondary | ICD-10-CM

## 2016-07-05 ENCOUNTER — Other Ambulatory Visit (HOSPITAL_COMMUNITY): Payer: Self-pay | Admitting: *Deleted

## 2016-07-05 ENCOUNTER — Encounter (HOSPITAL_COMMUNITY): Payer: Self-pay | Admitting: *Deleted

## 2016-07-05 DIAGNOSIS — N644 Mastodynia: Secondary | ICD-10-CM

## 2016-07-05 DIAGNOSIS — N6452 Nipple discharge: Secondary | ICD-10-CM

## 2016-07-07 ENCOUNTER — Telehealth (HOSPITAL_COMMUNITY): Payer: Self-pay | Admitting: *Deleted

## 2016-07-07 NOTE — Telephone Encounter (Signed)
Telephoned patient at home # and left message with [facility] appointment Thursday August 10 1:15

## 2016-07-08 ENCOUNTER — Other Ambulatory Visit: Payer: Self-pay | Admitting: Obstetrics and Gynecology

## 2016-07-08 ENCOUNTER — Ambulatory Visit (HOSPITAL_COMMUNITY)
Admission: RE | Admit: 2016-07-08 | Discharge: 2016-07-08 | Disposition: A | Discharge status: Home / Self Care | Payer: Self-pay | Source: Ambulatory Visit | Attending: Obstetrics and Gynecology | Admitting: Obstetrics and Gynecology

## 2016-07-08 ENCOUNTER — Encounter (HOSPITAL_COMMUNITY): Payer: Self-pay

## 2016-07-08 ENCOUNTER — Ambulatory Visit
Admission: RE | Admit: 2016-07-08 | Discharge: 2016-07-08 | Disposition: A | Discharge status: Home / Self Care | Payer: No Typology Code available for payment source | Source: Ambulatory Visit | Attending: Obstetrics and Gynecology | Admitting: Obstetrics and Gynecology

## 2016-07-08 VITALS — BP 116/72 | Temp 99.1°F | Ht 61.25 in | Wt 200.4 lb

## 2016-07-08 DIAGNOSIS — N644 Mastodynia: Secondary | ICD-10-CM

## 2016-07-08 DIAGNOSIS — N6452 Nipple discharge: Secondary | ICD-10-CM

## 2016-07-08 DIAGNOSIS — Z1239 Encounter for other screening for malignant neoplasm of breast: Secondary | ICD-10-CM

## 2016-07-08 MED ORDER — FLUCONAZOLE 150 MG PO TABS: 150.0000 mg | ORAL_TABLET | Freq: Once | ORAL | 0 refills | Status: AC

## 2016-07-08 NOTE — Patient Instructions (Addendum)
Explained breast self awareness to [name]. Patient did not need a Pap smear today due to her history of a hysterectomy for benign reasons. Let patient know that she doesn't need any further Pap smears due to her history of a hysterectomy for benign reasons. Referred patient to the [facility] for diagnostic mammogram and possible right breast ultrasound. Appointment scheduled for Thursday, July 08, 2016 at 1440. Told patient I will check to see if we can send a prescription to her pharmacy for yeast. Let patient know we will call her to let her know. [name] verbalized understanding.  Koah Chisenhall, Arvil Chaco, RN 2:56 PM

## 2016-07-08 NOTE — Progress Notes (Signed)
Complaints of right breast spontaneous brownish/yellowish nipple discharge. Patient complained bilateral breast pain around the areola that comes and goes throughout the day that started in May. Patient rates pain at a 5 out of 10. Patient stated her right breast it itchy.   Pap Smear:  Pap smear not completed today. Last Pap smear was in 2012 and normal per patient. Per patient has a history of an abnormal Pap smear 10-15 years ago that a repeat Pap smear was completed for follow up. Patient states all Pap smears have been normal since. Patient has a history of a partial hysterectomy that her uterus and cervix were removed due to endometriosis and fibroids. Patient states she still has both ovaries. Patient no longer needs Pap smears due to her history of a hysterectomy for benign reasons per BCCCP and ACOG guidelines. No Pap smear results are in EPIC.  Physical exam: Breasts Breasts symmetrical. Bilateral rashes under breasts that are consistent with yeast. Bilateral nipples dry and flaky. Patient complained of bilateral breast itchiness that is greater right breast. No nipple retraction bilateral breasts. No nipple discharge bilateral breasts. Patient complained of right breast discharge that is yellowish to brownish that happens spontaneously. Unable to express any nipple discharge on exam. No lymphadenopathy. No lumps palpated bilateral breasts. Complaints of right breast pain on exam around areola. Referred patient to the [facility] for diagnostic mammogram and possible right breast ultrasound. Appointment scheduled for Thursday, July 08, 2016 at 1440.        Pelvic/Bimanual No Pap smear completed today since patient has a history of a hysterectomy for benign reasons. Pap smear not indicated per BCCCP guidelines.   Smoking History: Patient has never smoked.  Patient Navigation: Patient education provided. Access to services provided for patient through [facility] program.

## 2016-07-12 ENCOUNTER — Encounter (HOSPITAL_COMMUNITY): Payer: Self-pay | Admitting: *Deleted

## 2016-07-12 ENCOUNTER — Telehealth (HOSPITAL_COMMUNITY): Payer: Self-pay | Admitting: *Deleted

## 2016-07-12 ENCOUNTER — Other Ambulatory Visit (HOSPITAL_COMMUNITY): Payer: Self-pay | Admitting: *Deleted

## 2016-07-12 ENCOUNTER — Other Ambulatory Visit: Payer: Self-pay | Admitting: Obstetrics and Gynecology

## 2016-07-12 MED ORDER — FLUCONAZOLE 150 MG PO TABS: 150.0000 mg | ORAL_TABLET | Freq: Once | ORAL | 0 refills | Status: AC

## 2016-07-12 NOTE — Telephone Encounter (Signed)
Attempted to call patient to let her know a prescription for yeast has been sent to her pharmacy. No on answered phone. Left message for patient to call me back.

## 2016-07-12 NOTE — Telephone Encounter (Signed)
Telephoned patient at home # and advised patient medication was called in for yeast. Patient to call back if doesn't help. Patient voiced understanding.

## 2016-07-27 ENCOUNTER — Telehealth: Payer: Self-pay | Admitting: Family Medicine

## 2016-07-27 NOTE — Telephone Encounter (Signed)
Needs ov  Add abilify 2 mg 1 po qd  #30  And make ov

## 2016-07-27 NOTE — Telephone Encounter (Signed)
Patient is on the 40 mg of Celexa but she is still feeling depressed, she said she is sleeping a lot, crying spells, Denied SI/Hi thoughts but she does feel hopeless, some days she does not want to do anyhting. Please advise    KP

## 2016-07-27 NOTE — Telephone Encounter (Signed)
Pt called in to discuss increasing her Rx for CELEXA. She says that she is feeling on the edge.  Please call pt to advise further.    CB: [phone]

## 2016-07-28 MED ORDER — ARIPIPRAZOLE 2 MG PO TABS
2.0000 mg | ORAL_TABLET | Freq: Every day | ORAL | 0 refills | Status: DC
Start: 2016-07-28 — End: 2016-10-13

## 2016-07-28 NOTE — Telephone Encounter (Signed)
Detailed message left advising the Abilify has been faxed and she needs to call back to scheduled the follow in 30 days.    KP

## 2016-10-13 ENCOUNTER — Telehealth: Payer: Self-pay

## 2016-10-13 ENCOUNTER — Telehealth: Payer: Self-pay | Admitting: Medical

## 2016-10-13 ENCOUNTER — Ambulatory Visit (INDEPENDENT_AMBULATORY_CARE_PROVIDER_SITE_OTHER): Payer: BLUE CROSS/BLUE SHIELD | Admitting: Medical

## 2016-10-13 ENCOUNTER — Ambulatory Visit (HOSPITAL_BASED_OUTPATIENT_CLINIC_OR_DEPARTMENT_OTHER)
Admission: RE | Admit: 2016-10-13 | Discharge: 2016-10-13 | Disposition: A | Discharge status: Home / Self Care | Payer: BLUE CROSS/BLUE SHIELD | Source: Ambulatory Visit | Attending: Medical | Admitting: Medical

## 2016-10-13 ENCOUNTER — Encounter: Payer: Self-pay | Admitting: Medical

## 2016-10-13 VITALS — BP 100/70 | HR 77 | Temp 98.2°F | Ht 61.5 in | Wt 199.0 lb

## 2016-10-13 DIAGNOSIS — R1013 Epigastric pain: Secondary | ICD-10-CM

## 2016-10-13 DIAGNOSIS — Z87891 Personal history of nicotine dependence: Secondary | ICD-10-CM

## 2016-10-13 DIAGNOSIS — R918 Other nonspecific abnormal finding of lung field: Secondary | ICD-10-CM | POA: Insufficient documentation

## 2016-10-13 DIAGNOSIS — B349 Viral infection, unspecified: Secondary | ICD-10-CM

## 2016-10-13 DIAGNOSIS — J0100 Acute maxillary sinusitis, unspecified: Secondary | ICD-10-CM | POA: Diagnosis not present

## 2016-10-13 DIAGNOSIS — M791 Myalgia, unspecified site: Secondary | ICD-10-CM

## 2016-10-13 DIAGNOSIS — R062 Wheezing: Secondary | ICD-10-CM | POA: Diagnosis not present

## 2016-10-13 DIAGNOSIS — R197 Diarrhea, unspecified: Secondary | ICD-10-CM

## 2016-10-13 LAB — POC INFLUENZA A&B (BINAX/QUICKVUE)
INFLUENZA B, POC: NEGATIVE
Influenza A, POC: NEGATIVE

## 2016-10-13 LAB — COMPREHENSIVE METABOLIC PANEL
ALBUMIN: 3.8 g/dL (ref 3.5–5.2)
ALT: 29 U/L (ref 0–35)
AST: 22 U/L (ref 0–37)
Alkaline Phosphatase: 101 U/L (ref 39–117)
BUN: 13 mg/dL (ref 6–23)
CHLORIDE: 102 meq/L (ref 96–112)
CO2: 31 mEq/L (ref 19–32)
CREATININE: 0.82 mg/dL (ref 0.40–1.20)
Calcium: 8.9 mg/dL (ref 8.4–10.5)
GFR: 79.72 mL/min (ref >60.00)
Glucose, Bld: 93 mg/dL (ref 70–99)
Potassium: 3.4 mEq/L — ABNORMAL LOW (ref 3.5–5.1)
Sodium: 139 mEq/L (ref 135–145)
Total Bilirubin: 0.4 mg/dL (ref 0.2–1.2)
Total Protein: 6.9 g/dL (ref 6.0–8.3)

## 2016-10-13 LAB — CBC WITH DIFFERENTIAL/PLATELET
Basophils Absolute: 0 10*3/uL (ref 0.0–0.1)
Basophils Relative: 0.6 % (ref 0.0–3.0)
Eosinophils Absolute: 0.3 10*3/uL (ref 0.0–0.7)
Eosinophils Relative: 4.1 % (ref 0.0–5.0)
HCT: 37.2 % (ref 36.0–46.0)
HEMOGLOBIN: 12.6 g/dL (ref 12.0–15.0)
Lymphocytes Relative: 22.9 % (ref 12.0–46.0)
Lymphs Abs: 1.7 10*3/uL (ref 0.7–4.0)
MCHC: 34 g/dL (ref 30.0–36.0)
MCV: 84.2 fl (ref 78.0–100.0)
Monocytes Absolute: 0.4 10*3/uL (ref 0.1–1.0)
Monocytes Relative: 5.1 % (ref 3.0–12.0)
Neutro Abs: 4.9 10*3/uL (ref 1.4–7.7)
Neutrophils Relative %: 67.3 % (ref 43.0–77.0)
Platelets: 269 10*3/uL (ref 150.0–400.0)
RBC: 4.42 Mil/uL (ref 3.87–5.11)
RDW: 13.1 % (ref 11.5–15.5)
WBC: 7.3 10*3/uL (ref 4.0–10.5)

## 2016-10-13 LAB — AMYLASE: AMYLASE: 47 U/L (ref 27–131)

## 2016-10-13 LAB — LIPASE: LIPASE: 28 U/L (ref 11.0–59.0)

## 2016-10-13 MED ORDER — DOXYCYCLINE HYCLATE 100 MG PO TABS: 100.0000 mg | ORAL_TABLET | Freq: Two times a day (BID) | ORAL | 0 refills | Status: DC

## 2016-10-13 MED ORDER — RANITIDINE HCL 150 MG PO CAPS
150.0000 mg | ORAL_CAPSULE | Freq: Two times a day (BID) | ORAL | 0 refills | Status: DC
Start: 2016-10-13 — End: 2016-12-14

## 2016-10-13 MED ORDER — BENZONATATE 100 MG PO CAPS: 100.0000 mg | ORAL_CAPSULE | Freq: Three times a day (TID) | ORAL | 0 refills | Status: DC | PRN

## 2016-10-13 MED ORDER — ALBUTEROL SULFATE HFA 108 (90 BASE) MCG/ACT IN AERS: 2.0000 | INHALATION_SPRAY | Freq: Four times a day (QID) | RESPIRATORY_TRACT | 0 refills | Status: DC | PRN

## 2016-10-13 NOTE — Telephone Encounter (Signed)
I spoke with [name] at [facility] and advised him that per a verbal order form Mackie Pai the patients Zantac could be switched to the tablets instead of the capsules since the patients insurance would only cover the tablets.

## 2016-10-13 NOTE — Patient Instructions (Addendum)
You appeared to have onset of viral syndrome/flu symptoms. We ran flu test today. You are 5 days post onset of symptoms so past treatment time frame with tamiflu. Test was neg. Maybe false negative.  You do appear to have some sinus infection symptoms and some bronchitis as well. For sinus infection and bronchitis rx doxycycline antibiotic, flonase nasal spray, and benzonatate for cough.  For wheezing rx albuterol. If using frequently let us know.  For severe fatigue and recent symptoms will get labs.  For mild loose stool eat bland diet. If diarrhea worsens turn in stool panel.  Ibuprofen for bodyaches.  For epigastric pain rx ranitidine.  Follow up in 7 days or as needed

## 2016-10-13 NOTE — Progress Notes (Signed)
Pre visit review using our clinic review tool, if applicable. No additional management support is needed unless otherwise documented below in the visit note. 

## 2016-10-13 NOTE — Telephone Encounter (Signed)
Future cxr order placed.

## 2016-10-13 NOTE — Progress Notes (Signed)
Subjective:    Patient ID: [name], female    DOB: 1971-01-28, 45 y.o.   MRN: [id number]  HPI  Pt in stating on Friday afternoon she got some mild abdomen pain, nausea, ha, and on Saturday felt diffuse achiness. Pt states she is still feeling fatigue.   Pt works in health care. She has not worked since Friday.  Pt is tired. Pt felt chills and sweats. But partial hysterectomy hx  and sweats/hotfashes at times.  Pt has some cough(dry). It is dry. Pt has been wheezing some.   Pt has some loose stools. But first two days had diarrhea. But not now.  Pt thinks she may have pneumonia based on having one time in her life and feeling similar.    Review of Systems  Constitutional: Positive for fatigue. Negative for chills and fever.  Respiratory: Positive for cough and wheezing. Negative for chest tightness and shortness of breath.        Mild wheeze.  Cardiovascular: Negative for chest pain and palpitations.  Gastrointestinal: Positive for abdominal pain, diarrhea and nausea.       See hpi. More loose stool now.  Musculoskeletal: Positive for myalgias. Negative for arthralgias, back pain, joint swelling, neck pain and neck stiffness.  Neurological: Positive for headaches. Negative for dizziness, tremors, syncope, facial asymmetry, speech difficulty, weakness, light-headedness and numbness.   Past Medical History:  Diagnosis Date  . Anxiety   . H/O endometritis   . Hypertension      Social History   Social History  . Marital status: Married    Spouse name: N/A  . Number of children: N/A  . Years of education: N/A   Occupational History  . teacher [employer]   Social History Main Topics  . Smoking status: Former Smoker    Quit date: 11/29/1988  . Smokeless tobacco: Never Used  . Alcohol use No  . Drug use: No  . Sexual activity: Yes    Partners: Male   Other Topics Concern  . Not on file   Social History Narrative  . No  narrative on file    Past Surgical History:  Procedure Laterality Date  . ABDOMINAL HYSTERECTOMY  05/05/2011   TAH---fibroids and endometriosis  . CHOLECYSTECTOMY  02/2010    Family History  Problem Relation Age of Onset  . Diabetes Mother   . Kidney disease Mother   . Hypertension Father   . Heart disease Father   . Rheum arthritis Father   . Cancer Paternal Uncle     liver with mets   . Heart disease Paternal Grandmother   . Breast cancer Paternal Grandmother   . Osteoarthritis Paternal Grandfather     No Known Allergies  Current Outpatient Prescriptions on File Prior to Visit  Medication Sig Dispense Refill  . ALPRAZolam (XANAX) 0.5 MG tablet Take 1 tablet (0.5 mg total) by mouth 3 (three) times daily as needed. 60 tablet 1  . citalopram (CELEXA) 40 MG tablet Take 1 tablet (40 mg total) by mouth daily. 90 tablet 3  . lisinopril-hydrochlorothiazide (PRINZIDE,ZESTORETIC) 20-25 MG tablet Take 1 tablet by mouth daily. 90 tablet 3   No current facility-administered medications on file prior to visit.     BP 100/70 (BP Location: Left Arm, Patient Position: Sitting)   Pulse 77   Temp 98.2 F (36.8 C) (Oral)   Ht 5' 1.5" (1.562 m)   Wt 199 lb (90.3 kg)   SpO2 98%  BMI 36.99 kg/m       Objective:   Physical Exam  General  Mental Status - Alert. General Appearance - Well groomed. Not in acute distress.  Skin Rashes- No Rashes.  HEENT Head- Normal. Ear Auditory Canal - Left- Normal. Right - Normal.Tympanic Membrane- Left- Normal. Right- Normal. Eye Sclera/Conjunctiva- Left- Normal. Right- Normal. Nose & Sinuses Nasal Mucosa- Left-  Boggy and Congested. Right-  Boggy and  Congested.Bilateral no  maxillary and  But rt frontal sinus pressure. Mouth & Throat Lips: Upper Lip- Normal: no dryness, cracking, pallor, cyanosis, or vesicular eruption. Lower Lip-Normal: no dryness, cracking, pallor, cyanosis or vesicular eruption. Buccal Mucosa- Bilateral- No Aphthous  ulcers. Oropharynx- No Discharge or Erythema. Tonsils: Characteristics- Bilateral- No Erythema or Congestion. Size/Enlargement- Bilateral- No enlargement. Discharge- bilateral-None.  Neck Neck- Supple. No Masses.   Chest and Lung Exam Auscultation: Breath Sounds:-Clear even and unlabored.  Cardiovascular Auscultation:Rythm- Regular, rate and rhythm. Murmurs & Other Heart Sounds:Ausculatation of the heart reveal- No Murmurs.  Lymphatic Head & Neck General Head & Neck Lymphatics: Bilateral: Description- No Localized lymphadenopathy.   Abdomen Inspection:-Inspection Normal.  Palpation/Perucssion: Palpation and Percussion of the abdomen reveal- faint epigastric Tender, No Rebound tenderness, No rigidity(Guarding) and No Palpable abdominal masses.  Liver:-Normal.  Spleen:- Normal.   Back- no cva pain.       Assessment & Plan:  You appeared to have onset of viral syndrome/flu symptoms. We ran flu test today. You are 5 days post onset of symptoms so past treatment time frame with tamiflu. Test was neg. Maybe false negative.  You do appear to have some sinus infection symptoms and some bronchitis as well. For sinus infection and bronchitis rx doxycycline antibiotic, flonase nasal spray, and benzonatate for cough.  For wheezing rx albuterol. If using frequently let us know.  For severe fatigue and recent symptoms will get labs.  For mild loose stool eat bland diet. If diarrhea worsens turn in stool panel.  Ibuprofen for bodyaches.  For epigastric pain rx ranitidine.  Follow up in 7 days or as needed   , Percell Miller, [employer]

## 2016-10-19 ENCOUNTER — Telehealth: Payer: Self-pay | Admitting: Medical

## 2016-10-19 NOTE — Telephone Encounter (Signed)
Pt seen 6 days ago. Approaching holiday. Will you offer her appointment tomorrow 10:15. Want to listen to her lungs and see what treatment changes need to be made before 4 day break.

## 2016-10-19 NOTE — Telephone Encounter (Signed)
She already has appt tomorrow morning at 8am.

## 2016-10-20 ENCOUNTER — Ambulatory Visit (HOSPITAL_BASED_OUTPATIENT_CLINIC_OR_DEPARTMENT_OTHER)
Admission: RE | Admit: 2016-10-20 | Discharge: 2016-10-20 | Disposition: A | Discharge status: Home / Self Care | Payer: BLUE CROSS/BLUE SHIELD | Source: Ambulatory Visit | Attending: Medical | Admitting: Medical

## 2016-10-20 ENCOUNTER — Ambulatory Visit (INDEPENDENT_AMBULATORY_CARE_PROVIDER_SITE_OTHER): Payer: BLUE CROSS/BLUE SHIELD | Admitting: Medical

## 2016-10-20 ENCOUNTER — Other Ambulatory Visit: Payer: Self-pay | Admitting: Medical

## 2016-10-20 VITALS — BP 100/60 | HR 78 | Temp 98.1°F | Ht 61.5 in | Wt 202.4 lb

## 2016-10-20 DIAGNOSIS — J0110 Acute frontal sinusitis, unspecified: Secondary | ICD-10-CM | POA: Diagnosis not present

## 2016-10-20 DIAGNOSIS — J189 Pneumonia, unspecified organism: Secondary | ICD-10-CM

## 2016-10-20 DIAGNOSIS — R918 Other nonspecific abnormal finding of lung field: Secondary | ICD-10-CM | POA: Insufficient documentation

## 2016-10-20 DIAGNOSIS — J9811 Atelectasis: Secondary | ICD-10-CM | POA: Insufficient documentation

## 2016-10-20 DIAGNOSIS — Z87891 Personal history of nicotine dependence: Secondary | ICD-10-CM

## 2016-10-20 DIAGNOSIS — R062 Wheezing: Secondary | ICD-10-CM

## 2016-10-20 DIAGNOSIS — R05 Cough: Secondary | ICD-10-CM | POA: Diagnosis present

## 2016-10-20 LAB — CBC WITH DIFFERENTIAL/PLATELET
Basophils Absolute: 0 10*3/uL (ref 0.0–0.1)
Basophils Relative: 0.5 % (ref 0.0–3.0)
EOS ABS: 0.3 10*3/uL (ref 0.0–0.7)
Eosinophils Relative: 3.8 % (ref 0.0–5.0)
HCT: 35.6 % — ABNORMAL LOW (ref 36.0–46.0)
Hemoglobin: 12.3 g/dL (ref 12.0–15.0)
Lymphocytes Relative: 24.5 % (ref 12.0–46.0)
Lymphs Abs: 1.9 10*3/uL (ref 0.7–4.0)
MCHC: 34.4 g/dL (ref 30.0–36.0)
MCV: 83.3 fl (ref 78.0–100.0)
Monocytes Absolute: 0.4 10*3/uL (ref 0.1–1.0)
Monocytes Relative: 5.4 % (ref 3.0–12.0)
NEUTROS ABS: 5.1 10*3/uL (ref 1.4–7.7)
NEUTROS PCT: 65.8 % (ref 43.0–77.0)
PLATELETS: 247 10*3/uL (ref 150.0–400.0)
RBC: 4.28 Mil/uL (ref 3.87–5.11)
RDW: 13.2 % (ref 11.5–15.5)
WBC: 7.7 10*3/uL (ref 4.0–10.5)

## 2016-10-20 MED ORDER — CEFTRIAXONE SODIUM 1 G IJ SOLR
1.0000 g | Freq: Once | INTRAMUSCULAR | Status: AC
  Administered 2016-10-20: 1 g via INTRAMUSCULAR

## 2016-10-20 NOTE — Progress Notes (Signed)
Pre visit review using our clinic review tool, if applicable. No additional management support is needed unless otherwise documented below in the visit note. 

## 2016-10-20 NOTE — Patient Instructions (Addendum)
Pt your history of pneumonia and likely sinusitis  will give rocpehin 1 gram im today. Continue the doxycycline. May need to give rocephin on Monday if you are not improved. Antibiotic choice limited due to med interaction.  For wheezing please get albuterol inhaler.  Get cbc and cxr today.  I do want you to follow up in 7 days or as needed.  We need to follow you axillary areas and see if they are still tender. Any nipple dc then need to expand work up per Mammogram report.

## 2016-10-20 NOTE — Progress Notes (Signed)
Subjective:    Patient ID: [name], female    DOB: 1971-09-09, 45 y.o.   MRN: [id number]  HPI  Pt in stating she is still feeling fatigued. Some mild ha this morning. She is still wheezing but not severe. Pt is not coughing up mucous. She has dry cough intermittently. Some occasional sneezing. She has concern for persisting pneumonia and wants aggressive treatment as approach long holiday weekend.  She mentioned that before I saw her she had some small lymph node that was enlarged. Pt had mammogram in July and was normal. There was no discharge from nipple. The imaging center gave one tablet of yeast.She had  Rash underneath of rt her breast that resolved. Pt states she no longer has any lymph node or lump in rt axillary area.  Pt had not gotten her albuterol yet but she is willing to do now.   Review of Systems  Constitutional: Positive for fatigue. Negative for chills and fever.  HENT: Negative for congestion, ear pain, mouth sores, nosebleeds, postnasal drip, sinus pain, sinus pressure and sore throat.   Respiratory: Positive for cough and wheezing. Negative for choking and shortness of breath.        Minimal but at times still present.  Cardiovascular: Negative for chest pain and palpitations.  Gastrointestinal: Negative for abdominal pain.  Musculoskeletal: Negative for back pain.  Skin: Negative for rash.  Neurological: Negative for dizziness and headaches.  Hematological: Negative for adenopathy. Does not bruise/bleed easily.  Psychiatric/Behavioral: Negative for behavioral problems and confusion. The patient is not nervous/anxious.     Past Medical History:  Diagnosis Date  . Anxiety   . H/O endometritis   . Hypertension      Social History   Social History  . Marital status: Married    Spouse name: N/A  . Number of children: N/A  . Years of education: N/A   Occupational History  . teacher [employer]    [employer]   Social History Main  Topics  . Smoking status: Former Smoker    Quit date: 11/29/1988  . Smokeless tobacco: Never Used  . Alcohol use No  . Drug use: No  . Sexual activity: Yes    Partners: Male   Other Topics Concern  . Not on file   Social History Narrative  . No narrative on file    Past Surgical History:  Procedure Laterality Date  . ABDOMINAL HYSTERECTOMY  05/05/2011   TAH---fibroids and endometriosis  . CHOLECYSTECTOMY  02/2010    Family History  Problem Relation Age of Onset  . Diabetes Mother   . Kidney disease Mother   . Hypertension Father   . Heart disease Father   . Rheum arthritis Father   . Cancer Paternal Uncle     liver with mets   . Heart disease Paternal Grandmother   . Breast cancer Paternal Grandmother   . Osteoarthritis Paternal Grandfather     No Known Allergies  Current Outpatient Prescriptions on File Prior to Visit  Medication Sig Dispense Refill  . albuterol (PROVENTIL HFA;VENTOLIN HFA) 108 (90 Base) MCG/ACT inhaler Inhale 2 puffs into the lungs every 6 (six) hours as needed for wheezing or shortness of breath. 1 Inhaler 0  . ALPRAZolam (XANAX) 0.5 MG tablet Take 1 tablet (0.5 mg total) by mouth 3 (three) times daily as needed. 60 tablet 1  . benzonatate (TESSALON) 100 MG capsule Take 1 capsule (100 mg total) by mouth 3 (three) times daily as  needed for cough. 21 capsule 0  . citalopram (CELEXA) 40 MG tablet Take 1 tablet (40 mg total) by mouth daily. 90 tablet 3  . doxycycline (VIBRA-TABS) 100 MG tablet Take 1 tablet (100 mg total) by mouth 2 (two) times daily. Can give caps or generic 14 tablet 0  . lisinopril-hydrochlorothiazide (PRINZIDE,ZESTORETIC) 20-25 MG tablet Take 1 tablet by mouth daily. 90 tablet 3  . ranitidine (ZANTAC) 150 MG capsule Take 1 capsule (150 mg total) by mouth 2 (two) times daily. 60 capsule 0   No current facility-administered medications on file prior to visit.     BP 100/60 (BP Location: Left Arm, Patient Position: Sitting, Cuff Size:  Normal)   Pulse 78   Temp 98.1 F (36.7 C) (Oral)   Ht 5' 1.5" (1.562 m)   Wt 202 lb 6.4 oz (91.8 kg)   SpO2 98%   BMI 37.62 kg/m       Objective:   Physical Exam  General  Mental Status - Alert. General Appearance - Well groomed. Not in acute distress.  Skin Rashes- No Rashes.  HEENT Head- Normal. Ear Auditory Canal - Left- Normal. Right - Normal.Tympanic Membrane- Left- Normal. Right- Normal. Eye Sclera/Conjunctiva- Left- Normal. Right- Normal. Nose & Sinuses Nasal Mucosa- Left-  Mild boggy and  Congested. Right-  Mild  boggy and  Congested. Rt frontal sinus is tender. Mouth & Throat Lips: Upper Lip- Normal: no dryness, cracking, pallor, cyanosis, or vesicular eruption. Lower Lip-Normal: no dryness, cracking, pallor, cyanosis or vesicular eruption. Buccal Mucosa- Bilateral- No Aphthous ulcers. Oropharynx- No Discharge or Erythema. Tonsils: Characteristics- Bilateral- No Erythema or Congestion. Size/Enlargement- Bilateral- No enlargement. Discharge- bilateral-None.  Neck Neck- Supple. No Masses.   Chest and Lung Exam Auscultation: Breath Sounds:- even and unlabored, but bilateral upper lobe rhonchi.  Cardiovascular Auscultation:Rythm- Regular, rate and rhythm. Murmurs & Other Heart Sounds:Ausculatation of the heart reveal- No Murmurs.  Lymphatic Head & Neck General Head & Neck Lymphatics: Bilateral: Description- No Localized lymphadenopathy.  Both axillary areas- hyperpigmented skin. Faint tender back area of axilla. No lymph nodes palpated.       Assessment & Plan:  Pt your history of pneumonia will give rocpehin 1 gram im today. Continue the doxycycline. May need to give rocephin on Monday if you are not improved. Antibiotic choice limited due to med interaction.  For wheezing please get albuterol inhaler.  Get cbc and cxr today.  I do want you to follow up in 7 days or as needed.  We need to follow you axillary areas and see if they are still  tender. Any nipple dc then need to expand work up per Mammogram report.

## 2016-10-25 ENCOUNTER — Encounter: Payer: Self-pay | Admitting: Medical

## 2016-10-25 DIAGNOSIS — E876 Hypokalemia: Secondary | ICD-10-CM

## 2016-10-26 NOTE — Progress Notes (Signed)
Pt has seen results on MyChart and message also sent for patient to call back if any questions. Patient has a follow up with Mackie Pai on 10/27/16./hsm

## 2016-10-26 NOTE — Progress Notes (Signed)
Pt has seen results on MyChart and message also sent for patient to call back if any questions. Patient has a follow up with [name] on 10/27/16.

## 2016-10-26 NOTE — Telephone Encounter (Signed)
I left a message for the patient advising of the note below and for the patient to call back with any questions. [name] will discuss the the stomach issues and the sugar problems at the visit on 10/27/16.

## 2016-10-26 NOTE — Telephone Encounter (Signed)
Please advise refill. Pt being treated for CAP. Has appt with you tomorrow. 10/27/16

## 2016-10-26 NOTE — Telephone Encounter (Signed)
Let pt know not filling antibiotic today since I am going to see her tomorrow for appointment. Will discuss her recent gi symptoms and concern for low blood sugar. Let her know did see my chart message.

## 2016-10-27 ENCOUNTER — Ambulatory Visit (HOSPITAL_BASED_OUTPATIENT_CLINIC_OR_DEPARTMENT_OTHER)
Admission: RE | Admit: 2016-10-27 | Discharge: 2016-10-27 | Disposition: A | Discharge status: Home / Self Care | Payer: BLUE CROSS/BLUE SHIELD | Source: Ambulatory Visit | Attending: Medical | Admitting: Medical

## 2016-10-27 ENCOUNTER — Encounter: Payer: Self-pay | Admitting: Medical

## 2016-10-27 ENCOUNTER — Ambulatory Visit (INDEPENDENT_AMBULATORY_CARE_PROVIDER_SITE_OTHER): Payer: BLUE CROSS/BLUE SHIELD | Admitting: Medical

## 2016-10-27 VITALS — BP 100/60 | HR 68 | Temp 98.3°F | Ht 62.0 in | Wt 202.0 lb

## 2016-10-27 DIAGNOSIS — Z8701 Personal history of pneumonia (recurrent): Secondary | ICD-10-CM | POA: Diagnosis not present

## 2016-10-27 DIAGNOSIS — R531 Weakness: Secondary | ICD-10-CM | POA: Diagnosis not present

## 2016-10-27 DIAGNOSIS — R5383 Other fatigue: Secondary | ICD-10-CM

## 2016-10-27 DIAGNOSIS — M40294 Other kyphosis, thoracic region: Secondary | ICD-10-CM | POA: Insufficient documentation

## 2016-10-27 DIAGNOSIS — R739 Hyperglycemia, unspecified: Secondary | ICD-10-CM

## 2016-10-27 LAB — COMPREHENSIVE METABOLIC PANEL
ALT: 28 U/L (ref 0–35)
AST: 22 U/L (ref 0–37)
Albumin: 3.8 g/dL (ref 3.5–5.2)
Alkaline Phosphatase: 125 U/L — ABNORMAL HIGH (ref 39–117)
BUN: 13 mg/dL (ref 6–23)
CHLORIDE: 101 meq/L (ref 96–112)
CO2: 32 meq/L (ref 19–32)
Calcium: 9.1 mg/dL (ref 8.4–10.5)
Creatinine, Ser: 0.82 mg/dL (ref 0.40–1.20)
GFR: 79.71 mL/min (ref >60.00)
GLUCOSE: 99 mg/dL (ref 70–99)
Potassium: 3.1 mEq/L — ABNORMAL LOW (ref 3.5–5.1)
Sodium: 140 mEq/L (ref 135–145)
Total Bilirubin: 0.4 mg/dL (ref 0.2–1.2)
Total Protein: 6.7 g/dL (ref 6.0–8.3)

## 2016-10-27 LAB — HEMOGLOBIN A1C: Hgb A1c MFr Bld: 5.7 % (ref 4.6–6.5)

## 2016-10-27 MED ORDER — POTASSIUM CHLORIDE ER 10 MEQ PO TBCR: 10.0000 meq | EXTENDED_RELEASE_TABLET | Freq: Every day | ORAL | 0 refills | Status: DC

## 2016-10-27 MED ORDER — AZELASTINE HCL 0.1 % NA SOLN: 2.0000 | Freq: Two times a day (BID) | NASAL | 3 refills | Status: DC

## 2016-10-27 NOTE — Progress Notes (Signed)
Pre visit review using our clinic review tool, if applicable. No additional management support is needed unless otherwise documented below in the visit note. 

## 2016-10-27 NOTE — Patient Instructions (Signed)
For your history of recent pneumonia will repeat cxr today see if area has cleared. May give additional antibiotic if not cleared.  Your wheezing seems to have improved a lot now. So appears you don't need the inhaler.  For nasal congestion will rx astelin.  Will get cmp and a1c today to check electrolytes and a1c. Get glucometer and check sugar occasionally after you eat or if symptomatic to see if high or low blood sugars.  Follow up date to be determined after lab review.

## 2016-10-27 NOTE — Progress Notes (Signed)
Subjective:    Patient ID: [name], female    DOB: 25-May-1971, 45 y.o.   MRN: [id number]  HPI   Pt in states she feels a lot better than last week. She feels some faint sinus pressure. No wheezing. No fever,no chills or sweats. Pt thought sinus pressure related to allergies and temperature. Pt last chest xray showed near complete resolution of the density.  Pt has finished doxycycline.    Pt also states eating the other day and while eating felt faint/weak on sunday. Mild disoriented. She assumed that her blood sugar was low. Pt does not have a glucometer. So she is not sure what her sugar levels are. Some excess thirst maybe. But no other hyperglycemic signs or symptoms. Urinates couple of times at night.    Review of Systems  Constitutional: Negative for chills, fatigue and fever.  HENT: Positive for congestion. Negative for ear discharge.   Eyes: Negative for pain, redness and visual disturbance.  Respiratory: Negative for cough, chest tightness, shortness of breath and wheezing.   Cardiovascular: Negative for chest pain and palpitations.  Endocrine: Negative for polydipsia, polyphagia and polyuria.       See hpi.  Musculoskeletal: Negative for arthralgias and back pain.  Skin: Negative for rash.  Neurological: Negative for dizziness, seizures and headaches.  Hematological: Negative for adenopathy. Does not bruise/bleed easily.  Psychiatric/Behavioral: Negative for behavioral problems and confusion.    Past Medical History:  Diagnosis Date  . Anxiety   . H/O endometritis   . Hypertension      Social History   Social History  . Marital status: Married    Spouse name: N/A  . Number of children: N/A  . Years of education: N/A   Occupational History  . teacher [employer]   Social History Main Topics  . Smoking status: Former Smoker    Quit date: 11/29/1988  . Smokeless tobacco: Never Used  . Alcohol use No  . Drug use: No    . Sexual activity: Yes    Partners: Male   Other Topics Concern  . Not on file   Social History Narrative  . No narrative on file    Past Surgical History:  Procedure Laterality Date  . ABDOMINAL HYSTERECTOMY  05/05/2011   TAH---fibroids and endometriosis  . CHOLECYSTECTOMY  02/2010    Family History  Problem Relation Age of Onset  . Diabetes Mother   . Kidney disease Mother   . Hypertension Father   . Heart disease Father   . Rheum arthritis Father   . Cancer Paternal Uncle     liver with mets   . Heart disease Paternal Grandmother   . Breast cancer Paternal Grandmother   . Osteoarthritis Paternal Grandfather     No Known Allergies  Current Outpatient Prescriptions on File Prior to Visit  Medication Sig Dispense Refill  . albuterol (PROVENTIL HFA;VENTOLIN HFA) 108 (90 Base) MCG/ACT inhaler Inhale 2 puffs into the lungs every 6 (six) hours as needed for wheezing or shortness of breath. 1 Inhaler 0  . ALPRAZolam (XANAX) 0.5 MG tablet Take 1 tablet (0.5 mg total) by mouth 3 (three) times daily as needed. 60 tablet 1  . citalopram (CELEXA) 40 MG tablet Take 1 tablet (40 mg total) by mouth daily. 90 tablet 3  . doxycycline (VIBRA-TABS) 100 MG tablet Take 1 tablet (100 mg total) by mouth 2 (two) times daily. Can give caps or generic 14 tablet 0  .  lisinopril-hydrochlorothiazide (PRINZIDE,ZESTORETIC) 20-25 MG tablet Take 1 tablet by mouth daily. 90 tablet 3  . ranitidine (ZANTAC) 150 MG capsule Take 1 capsule (150 mg total) by mouth 2 (two) times daily. 60 capsule 0   No current facility-administered medications on file prior to visit.     BP 100/60 (BP Location: Left Arm, Patient Position: Sitting, Cuff Size: Normal)   Pulse 68   Temp 98.3 F (36.8 C) (Oral)   Ht 5\' 2"  (1.575 m)   Wt 202 lb (91.6 kg)   SpO2 98%   BMI 36.95 kg/m       Objective:   Physical Exam  General  Mental Status - Alert. General Appearance - Well groomed. Not in acute  distress.  Skin Rashes- No Rashes.  HEENT Head- Normal. Ear Auditory Canal - Left- Normal. Right - Normal.Tympanic Membrane- Left- Normal. Right- Normal. Eye Sclera/Conjunctiva- Left- Normal. Right- Normal. Nose & Sinuses Nasal Mucosa- Left-  Boggy and Congested. Right-  Boggy and  Congested.Bilateral maxillary and frontal sinus pressure. Mouth & Throat Lips: Upper Lip- Normal: no dryness, cracking, pallor, cyanosis, or vesicular eruption. Lower Lip-Normal: no dryness, cracking, pallor, cyanosis or vesicular eruption. Buccal Mucosa- Bilateral- No Aphthous ulcers. Oropharynx- No Discharge or Erythema. Tonsils: Characteristics- Bilateral- No Erythema or Congestion. Size/Enlargement- Bilateral- No enlargement. Discharge- bilateral-None.  Neck Neck- Supple. No Masses.   Chest and Lung Exam Auscultation: Breath Sounds:-Clear even and unlabored.  Cardiovascular Auscultation:Rythm- Regular, rate and rhythm. Murmurs & Other Heart Sounds:Ausculatation of the heart reveal- No Murmurs.  Lymphatic Head & Neck General Head & Neck Lymphatics: Bilateral: Description- No Localized lymphadenopathy.       Assessment & Plan:  For your history of recent pneumonia will repeat cxr today see if area has cleared. May give additional antibiotic if not cleared.  Your wheezing seems to have improved a lot now. So appears you don't need the inhaler.  For nasal congestion will rx astelin.  Will get cmp and a1c today to check electrolytes and a1c. Get glucometer and check sugar occasionally after you eat or if symptomatic to see if high or low blood sugars.  Follow up date to be determined after lab review.   , Percell Miller, PA-C

## 2016-10-29 NOTE — Progress Notes (Signed)
Pt has seen results on MyChart and message also sent for patient to call back if any questions.

## 2016-11-01 ENCOUNTER — Other Ambulatory Visit: Payer: BLUE CROSS/BLUE SHIELD

## 2016-12-14 ENCOUNTER — Ambulatory Visit (INDEPENDENT_AMBULATORY_CARE_PROVIDER_SITE_OTHER): Payer: Self-pay | Admitting: Family Medicine

## 2016-12-14 ENCOUNTER — Encounter: Payer: Self-pay | Admitting: Family Medicine

## 2016-12-14 VITALS — BP 122/80 | HR 89 | Temp 98.0°F | Resp 16 | Ht 62.0 in | Wt 204.0 lb

## 2016-12-14 DIAGNOSIS — I10 Essential (primary) hypertension: Secondary | ICD-10-CM

## 2016-12-14 DIAGNOSIS — L301 Dyshidrosis [pompholyx]: Secondary | ICD-10-CM

## 2016-12-14 DIAGNOSIS — F419 Anxiety disorder, unspecified: Secondary | ICD-10-CM

## 2016-12-14 DIAGNOSIS — Z0000 Encounter for general adult medical examination without abnormal findings: Secondary | ICD-10-CM

## 2016-12-14 DIAGNOSIS — F418 Other specified anxiety disorders: Secondary | ICD-10-CM

## 2016-12-14 DIAGNOSIS — F411 Generalized anxiety disorder: Secondary | ICD-10-CM

## 2016-12-14 DIAGNOSIS — Z23 Encounter for immunization: Secondary | ICD-10-CM

## 2016-12-14 DIAGNOSIS — F329 Major depressive disorder, single episode, unspecified: Secondary | ICD-10-CM

## 2016-12-14 MED ORDER — LISINOPRIL-HYDROCHLOROTHIAZIDE 20-25 MG PO TABS: 1.0000 | ORAL_TABLET | Freq: Every day | ORAL | 1 refills | Status: DC

## 2016-12-14 MED ORDER — ALPRAZOLAM 0.5 MG PO TABS: 0.5000 mg | ORAL_TABLET | Freq: Three times a day (TID) | ORAL | 2 refills | Status: DC | PRN

## 2016-12-14 MED ORDER — CITALOPRAM HYDROBROMIDE 40 MG PO TABS: 40.0000 mg | ORAL_TABLET | Freq: Every day | ORAL | 1 refills | Status: DC

## 2016-12-14 NOTE — Assessment & Plan Note (Signed)
con't celexa and xanax  

## 2016-12-14 NOTE — Assessment & Plan Note (Signed)
ghm utd Check labs  See avs  

## 2016-12-14 NOTE — Progress Notes (Signed)
Pre visit review using our clinic review tool, if applicable. No additional management support is needed unless otherwise documented below in the visit note. 

## 2016-12-14 NOTE — Progress Notes (Signed)
Subjective:     [name] is a 46 y.o. female and is here for a comprehensive physical exam. The patient reports no problems.  Social History   Social History  . Marital status: Married    Spouse name: N/A  . Number of children: N/A  . Years of education: N/A   Occupational History  . teacher [employer]   Social History Main Topics  . Smoking status: Former Smoker    Quit date: 11/29/1988  . Smokeless tobacco: Never Used  . Alcohol use No  . Drug use: No  . Sexual activity: Yes    Partners: Male   Other Topics Concern  . Not on file   Social History Narrative  . No narrative on file   Health Maintenance  Topic Date Due  . INFLUENZA VACCINE  06/29/2016  . HIV Screening  06/08/2017 (Originally 08/22/1985)  . TETANUS/TDAP  07/06/2017  . MAMMOGRAM  07/08/2017    The following portions of the patient's history were reviewed and updated as appropriate: She  has a past medical history of Anxiety; H/O endometritis; and Hypertension. She  does not have any pertinent problems on file. She  has a past surgical history that includes Abdominal hysterectomy (05/05/2011) and Cholecystectomy (02/2010). Her family history includes Breast cancer in her paternal grandmother; Cancer in her paternal uncle; Diabetes in her mother; Heart disease in her father and paternal grandmother; Hypertension in her father; Kidney disease in her mother; Osteoarthritis in her paternal grandfather; Rheum arthritis in her father. She  reports that she quit smoking about 28 years ago. She has never used smokeless tobacco. She reports that she does not drink alcohol or use drugs. She has a current medication list which includes the following prescription(s): alprazolam, azelastine, citalopram, and lisinopril-hydrochlorothiazide. Current Outpatient Prescriptions on File Prior to Visit  Medication Sig Dispense Refill  . ALPRAZolam (XANAX) 0.5 MG tablet Take 1 tablet (0.5 mg  total) by mouth 3 (three) times daily as needed. 60 tablet 1  . azelastine (ASTELIN) 0.1 % nasal spray Place 2 sprays into both nostrils 2 (two) times daily. Use in each nostril as directed 30 mL 3  . citalopram (CELEXA) 40 MG tablet Take 1 tablet (40 mg total) by mouth daily. 90 tablet 3  . lisinopril-hydrochlorothiazide (PRINZIDE,ZESTORETIC) 20-25 MG tablet Take 1 tablet by mouth daily. 90 tablet 3   No current facility-administered medications on file prior to visit.    She has No Known Allergies..  Review of Systems Review of Systems  Constitutional: Negative for activity change, appetite change and fatigue.  HENT: Negative for hearing loss, congestion, tinnitus and ear discharge.  dentist q21m Eyes: Negative for visual disturbance (see optho q1y -- vision corrected to 20/20 with glasses).  Respiratory: Negative for cough, chest tightness and shortness of breath.   Cardiovascular: Negative for chest pain, palpitations and leg swelling.  Gastrointestinal: Negative for abdominal pain, diarrhea, constipation and abdominal distention.  Genitourinary: Negative for urgency, frequency, decreased urine volume and difficulty urinating.  Musculoskeletal: Negative for back pain, arthralgias and gait problem.  Skin: Negative for color change, pallor and rash.  Neurological: Negative for dizziness, light-headedness, numbness and headaches.  Hematological: Negative for adenopathy. Does not bruise/bleed easily.  Psychiatric/Behavioral: Negative for suicidal ideas, confusion, sleep disturbance, self-injury, dysphoric mood, decreased concentration and agitation.       Objective:    BP 122/80 (BP Location: Right Arm, Cuff Size: Normal)   Pulse 89  Temp 98 F (36.7 C) (Oral)   Resp 16   Ht 5\' 2"  (1.575 m)   Wt 204 lb (92.5 kg)   SpO2 98%   BMI 37.31 kg/m  General appearance: alert, cooperative, appears stated age and no distress Head: Normocephalic, without obvious abnormality,  atraumatic Eyes: conjunctivae/corneas clear. PERRL, EOM's intact. Fundi benign. Ears: normal TM's and external ear canals both ears Nose: Nares normal. Septum midline. Mucosa normal. No drainage or sinus tenderness. Throat: lips, mucosa, and tongue normal; teeth and gums normal Neck: no adenopathy, no carotid bruit, no JVD, supple, symmetrical, trachea midline and thyroid not enlarged, symmetric, no tenderness/mass/nodules Back: symmetric, no curvature. ROM normal. No CVA tenderness. Lungs: clear to auscultation bilaterally Breasts: dry skin and scaley and red R nipple Heart: regular rate and rhythm, S1, S2 normal, no murmur, click, rub or gallop Abdomen: soft, non-tender; bowel sounds normal; no masses,  no organomegaly Pelvic: not indicated; status post hysterectomy, negative ROS Extremities: extremities normal, atraumatic, no cyanosis or edema Pulses: 2+ and symmetric Skin: Skin color, texture, turgor normal. No rashes or lesions Lymph nodes: Cervical, supraclavicular, and axillary nodes normal. Neurologic: Alert and oriented X 3, normal strength and tone. Normal symmetric reflexes. Normal coordination and gait    Assessment:    Healthy female exam.      Plan:     See After Visit Summary for Counseling Recommendations   ghm utd Check labs 1. Preventative health care See above - Lipid panel; Future  2. Influenza vaccine needed   - Flu Vaccine QUAD 36+ mos IM (Fluarix & Fluzone Quad PF  3. Eczema, dyshidrotic   - Ambulatory referral to Dermatology  4. Anxiety and depression   - TSH; Future - citalopram (CELEXA) 40 MG tablet; Take 1 tablet (40 mg total) by mouth daily.  Dispense: 90 tablet; Refill: 1  5. Essential hypertension   - Comprehensive metabolic panel; Future - Lipid panel; Future - CBC; Future - lisinopril-hydrochlorothiazide (PRINZIDE,ZESTORETIC) 20-25 MG tablet; Take 1 tablet by mouth daily.  Dispense: 90 tablet; Refill: 1  6. Generalized anxiety  disorder   - ALPRAZolam (XANAX) 0.5 MG tablet; Take 1 tablet (0.5 mg total) by mouth 3 (three) times daily as needed.  Dispense: 60 tablet; Refill: 2

## 2016-12-14 NOTE — Assessment & Plan Note (Signed)
Stable con't lisinopril hct  

## 2016-12-14 NOTE — Patient Instructions (Addendum)
Please come back fasting for your lab work.  Preventive Care 40-64 Years, Female Preventive care refers to lifestyle choices and visits with your health care provider that can promote health and wellness. What does preventive care include?  A yearly physical exam. This is also called an annual well check.  Dental exams once or twice a year.  Routine eye exams. Ask your health care provider how often you should have your eyes checked.  Personal lifestyle choices, including:  Daily care of your teeth and gums.  Regular physical activity.  Eating a healthy diet.  Avoiding tobacco and drug use.  Limiting alcohol use.  Practicing safe sex.  Taking low-dose aspirin daily starting at age 37.  Taking vitamin and mineral supplements as recommended by your health care provider. What happens during an annual well check? The services and screenings done by your health care provider during your annual well check will depend on your age, overall health, lifestyle risk factors, and family history of disease. Counseling  Your health care provider may ask you questions about your:  Alcohol use.  Tobacco use.  Drug use.  Emotional well-being.  Home and relationship well-being.  Sexual activity.  Eating habits.  Work and work Statistician.  Method of birth control.  Menstrual cycle.  Pregnancy history. Screening  You may have the following tests or measurements:  Height, weight, and BMI.  Blood pressure.  Lipid and cholesterol levels. These may be checked every 5 years, or more frequently if you are over 1 years old.  Skin check.  Lung cancer screening. You may have this screening every year starting at age 41 if you have a 30-pack-year history of smoking and currently smoke or have quit within the past 15 years.  Fecal occult blood test (FOBT) of the stool. You may have this test every year starting at age 68.  Flexible sigmoidoscopy or colonoscopy. You may have a  sigmoidoscopy every 5 years or a colonoscopy every 10 years starting at age 63.  Hepatitis C blood test.  Hepatitis B blood test.  Sexually transmitted disease (STD) testing.  Diabetes screening. This is done by checking your blood sugar (glucose) after you have not eaten for a while (fasting). You may have this done every 1-3 years.  Mammogram. This may be done every 1-2 years. Talk to your health care provider about when you should start having regular mammograms. This may depend on whether you have a family history of breast cancer.  BRCA-related cancer screening. This may be done if you have a family history of breast, ovarian, tubal, or peritoneal cancers.  Pelvic exam and Pap test. This may be done every 3 years starting at age 32. Starting at age 68, this may be done every 5 years if you have a Pap test in combination with an HPV test.  Bone density scan. This is done to screen for osteoporosis. You may have this scan if you are at high risk for osteoporosis. Discuss your test results, treatment options, and if necessary, the need for more tests with your health care provider. Vaccines  Your health care provider may recommend certain vaccines, such as:  Influenza vaccine. This is recommended every year.  Tetanus, diphtheria, and acellular pertussis (Tdap, Td) vaccine. You may need a Td booster every 10 years.  Varicella vaccine. You may need this if you have not been vaccinated.  Zoster vaccine. You may need this after age 82.  Measles, mumps, and rubella (MMR) vaccine. You may need at  least one dose of MMR if you were born in 1957 or later. You may also need a second dose.  Pneumococcal 13-valent conjugate (PCV13) vaccine. You may need this if you have certain conditions and were not previously vaccinated.  Pneumococcal polysaccharide (PPSV23) vaccine. You may need one or two doses if you smoke cigarettes or if you have certain conditions.  Meningococcal vaccine. You may  need this if you have certain conditions.  Hepatitis A vaccine. You may need this if you have certain conditions or if you travel or work in places where you may be exposed to hepatitis A.  Hepatitis B vaccine. You may need this if you have certain conditions or if you travel or work in places where you may be exposed to hepatitis B.  Haemophilus influenzae type b (Hib) vaccine. You may need this if you have certain conditions. Talk to your health care provider about which screenings and vaccines you need and how often you need them. This information is not intended to replace advice given to you by your health care provider. Make sure you discuss any questions you have with your health care provider. Document Released: 12/12/2015 Document Revised: 08/04/2016 Document Reviewed: 09/16/2015 Elsevier Interactive Patient Education  2017 [place].    Influenza (Flu) Vaccine (Inactivated or Recombinant): What You Need to Know 1. Why get vaccinated? Influenza ("flu") is a contagious disease that spreads around the [place] every year, usually between October and May. Flu is caused by influenza viruses, and is spread mainly by coughing, sneezing, and close contact. Anyone can get flu. Flu strikes suddenly and can last several days. Symptoms vary by age, but can include:  fever/chills  sore throat  muscle aches  fatigue  cough  headache  runny or stuffy nose Flu can also lead to pneumonia and blood infections, and cause diarrhea and seizures in children. If you have a medical condition, such as heart or lung disease, flu can make it worse. Flu is more dangerous for some people. Infants and young children, people 22 years of age and older, pregnant women, and people with certain health conditions or a weakened immune system are at greatest risk. Each year thousands of people in the [place] die from flu, and many more are hospitalized. Flu vaccine can:  keep you from  getting flu,  make flu less severe if you do get it, and  keep you from spreading flu to your family and other people. 2. Inactivated and recombinant flu vaccines A dose of flu vaccine is recommended every flu season. Children 6 months through 45 years of age may need two doses during the same flu season. Everyone else needs only one dose each flu season. Some inactivated flu vaccines contain a very small amount of a mercury-based preservative called thimerosal. Studies have not shown thimerosal in vaccines to be harmful, but flu vaccines that do not contain thimerosal are available. There is no live flu virus in flu shots. They cannot cause the flu. There are many flu viruses, and they are always changing. Each year a new flu vaccine is made to protect against three or four viruses that are likely to cause disease in the upcoming flu season. But even when the vaccine doesn't exactly match these viruses, it may still provide some protection. Flu vaccine cannot prevent:  flu that is caused by a virus not covered by the vaccine, or  illnesses that look like flu but are not. It takes about 2 weeks for protection  to develop after vaccination, and protection lasts through the flu season. 3. Some people should not get this vaccine Tell the person who is giving you the vaccine:  If you have any severe, life-threatening allergies. If you ever had a life-threatening allergic reaction after a dose of flu vaccine, or have a severe allergy to any part of this vaccine, you may be advised not to get vaccinated. Most, but not all, types of flu vaccine contain a small amount of egg protein.  If you ever had Guillain-Barr Syndrome (also called GBS). Some people with a history of GBS should not get this vaccine. This should be discussed with your doctor.  If you are not feeling well. It is usually okay to get flu vaccine when you have a mild illness, but you might be asked to come back when you feel better. 4.  Risks of a vaccine reaction With any medicine, including vaccines, there is a chance of reactions. These are usually mild and go away on their own, but serious reactions are also possible. Most people who get a flu shot do not have any problems with it. Minor problems following a flu shot include:  soreness, redness, or swelling where the shot was given  hoarseness  sore, red or itchy eyes  cough  fever  aches  headache  itching  fatigue If these problems occur, they usually begin soon after the shot and last 1 or 2 days. More serious problems following a flu shot can include the following:  There may be a small increased risk of Guillain-Barre Syndrome (GBS) after inactivated flu vaccine. This risk has been estimated at 1 or 2 additional cases per million people vaccinated. This is much lower than the risk of severe complications from flu, which can be prevented by flu vaccine.  Young children who get the flu shot along with pneumococcal vaccine (PCV13) and/or DTaP vaccine at the same time might be slightly more likely to have a seizure caused by fever. Ask your doctor for more information. Tell your doctor if a child who is getting flu vaccine has ever had a seizure. Problems that could happen after any injected vaccine:  People sometimes faint after a medical procedure, including vaccination. Sitting or lying down for about 15 minutes can help prevent fainting, and injuries caused by a fall. Tell your doctor if you feel dizzy, or have vision changes or ringing in the ears.  Some people get severe pain in the shoulder and have difficulty moving the arm where a shot was given. This happens very rarely.  Any medication can cause a severe allergic reaction. Such reactions from a vaccine are very rare, estimated at about 1 in a million doses, and would happen within a few minutes to a few hours after the vaccination. As with any medicine, there is a very remote chance of a vaccine  causing a serious injury or death. The safety of vaccines is always being monitored. For more information, visit: http://www.aguilar.org/ 5. What if there is a serious reaction? What should I look for? Look for anything that concerns you, such as signs of a severe allergic reaction, very high fever, or unusual behavior. Signs of a severe allergic reaction can include hives, swelling of the face and throat, difficulty breathing, a fast heartbeat, dizziness, and weakness. These would start a few minutes to a few hours after the vaccination. What should I do?  If you think it is a severe allergic reaction or other emergency that can't wait,  call 9-1-1 and get the person to the nearest hospital. Otherwise, call your doctor.  Reactions should be reported to the Vaccine Adverse Event Reporting System (VAERS). Your doctor should file this report, or you can do it yourself through the VAERS web site at www.vaers.SamedayNews.es, or by calling (575) 288-7685.  VAERS does not give medical advice. 6. The National Vaccine Injury Compensation Program The [employer] Vaccine Injury Compensation Program (VICP) is a federal program that was created to compensate people who may have been injured by certain vaccines. Persons who believe they may have been injured by a vaccine can learn about the program and about filing a claim by calling 914-302-7319 or visiting the [place] website at GoldCloset.com.ee. There is a time limit to file a claim for compensation. 7. How can I learn more?  Ask your healthcare provider. He or she can give you the vaccine package insert or suggest other sources of information.  Call your local or state health department.  Contact the Centers for Disease Control and Prevention (CDC):  Call 801-770-8408 (1-800-CDC-INFO) or  Visit CDC's website at https://gibson.com/ Vaccine Information Statement, Inactivated Influenza Vaccine (07/05/2014) This information is not intended to  replace advice given to you by your health care provider. Make sure you discuss any questions you have with your health care provider. Document Released: 09/09/2006 Document Revised: 08/05/2016 Document Reviewed: 08/05/2016 Elsevier Interactive Patient Education  2017 [employer].

## 2016-12-17 ENCOUNTER — Encounter: Payer: Self-pay | Admitting: Family Medicine

## 2017-01-10 ENCOUNTER — Telehealth: Payer: Self-pay | Admitting: Family Medicine

## 2017-01-10 NOTE — Telephone Encounter (Signed)
Caller name: Relationship to patient: Self Can be reached: [phone]  Pharmacy:  Reason for call: Patient request call back to to find out what she can take OTC for her cold symptoms. Plse adv

## 2017-01-11 NOTE — Telephone Encounter (Signed)
AGREE

## 2017-01-11 NOTE — Telephone Encounter (Signed)
What symptoms is she having? Use her astelin She can also use flonase, nasacort or rhinocort for nasal congestion And an antihistmine for runny nose / sneezing--- xyrtec, xyzal claritin or allegra

## 2017-01-11 NOTE — Telephone Encounter (Signed)
Patient is taking only tylenol for her headache.  Has some wheezing in her chest, coughing, fever last night --did not check (felt hot),chills, weakness and SOB.  She has had pneumonia this year already and concerned. Not a whole lot of nasal congestion. Encouraged the patient to schedule appointment asap. She agreed to do so and is scheduled for 01/12/2017 with Mackie Pai at 11:15 AM Will route message to PCP to review her response to symptoms

## 2017-01-12 ENCOUNTER — Ambulatory Visit: Payer: BLUE CROSS/BLUE SHIELD | Admitting: Medical

## 2017-01-31 ENCOUNTER — Other Ambulatory Visit: Payer: Self-pay | Admitting: Family Medicine

## 2017-01-31 DIAGNOSIS — F411 Generalized anxiety disorder: Secondary | ICD-10-CM

## 2017-01-31 NOTE — Telephone Encounter (Signed)
Last filled 12/14/16  #60 w/ 2RF Last ov 12/14/16 Contract signed 12/14/16 Next UDS 06/13/17

## 2017-01-31 NOTE — Telephone Encounter (Signed)
rx faxed to pharmacy

## 2017-02-13 ENCOUNTER — Encounter: Payer: Self-pay | Admitting: Family Medicine

## 2017-02-14 NOTE — Telephone Encounter (Signed)
As long as we have done a cpe

## 2017-02-16 ENCOUNTER — Telehealth: Payer: Self-pay | Admitting: Family Medicine

## 2017-02-16 NOTE — Telephone Encounter (Signed)
Pt dropped off document to be filled out (Medical Vaccination/ Physical examination Form). Pt would like to have document mailed to her at [address], [place], [place] [address]. Document put at front office tray.  (pt tel [phone])

## 2017-02-22 NOTE — Telephone Encounter (Signed)
Form received and is currently being processed. MyChart message sent to patient letting her know where we are in the process.

## 2017-02-23 ENCOUNTER — Telehealth: Payer: Self-pay | Admitting: Family Medicine

## 2017-02-24 NOTE — Telephone Encounter (Signed)
error:[id number] ° °

## 2017-02-24 NOTE — Telephone Encounter (Signed)
Patient called to follow up on forms. Plse call back with any questions

## 2017-03-01 NOTE — Telephone Encounter (Signed)
TB skin test results received and information added to patient's form.  Called patient and made her aware that we are still missing some of her immunizations records and that without this information we will be unable to complete form.  Pt stated understanding and inquired about the out of pocket cost (without insurance) of the immunizations/titers needed to complete form.  Pt was informed that we may be about to conduct a price inquiry, however, the values given would only be an estimation.  Pt stated understanding and then asked she received the vaccines at another facility or had titers drawn at another site, would Dr. Etter Sjogren release the form with the information completed thus far.  Informed patient that I would speak with Dr. Etter Sjogren and give her an update as soon as possible.  Pt stated understanding.  No additional needs voiced at this time.

## 2017-03-01 NOTE — Telephone Encounter (Signed)
Pt dropped off another document with Tuberculin Testing Report so that it can be used to fill out her Physical Examination Form. Pt would like to be called when document ready at [facility] [phone] or [phone]. Pt states is needing her Physical Examination Form by today filled out (will pick up after 4:00 today if possible). Document put at front office tray.

## 2017-03-03 NOTE — Telephone Encounter (Signed)
Called patient and left a message for call back.  Dr. Etter Sjogren advises that patient come in to office for blood titers for MMR, Varicella, and Hep B.

## 2017-03-07 ENCOUNTER — Encounter: Payer: Self-pay | Admitting: Family Medicine

## 2017-03-08 ENCOUNTER — Other Ambulatory Visit: Payer: Self-pay

## 2017-03-08 DIAGNOSIS — Z0184 Encounter for antibody response examination: Secondary | ICD-10-CM

## 2017-03-08 NOTE — Telephone Encounter (Signed)
Future labs ordered.  

## 2017-03-08 NOTE — Telephone Encounter (Signed)
MyChart message sent . Future labs ordered.

## 2017-08-08 ENCOUNTER — Encounter: Payer: Self-pay | Admitting: Family Medicine

## 2017-08-08 ENCOUNTER — Other Ambulatory Visit: Payer: Self-pay | Admitting: Family Medicine

## 2017-08-08 ENCOUNTER — Telehealth: Payer: Self-pay | Admitting: Family Medicine

## 2017-08-08 DIAGNOSIS — F419 Anxiety disorder, unspecified: Secondary | ICD-10-CM

## 2017-08-08 DIAGNOSIS — I10 Essential (primary) hypertension: Secondary | ICD-10-CM

## 2017-08-08 DIAGNOSIS — F411 Generalized anxiety disorder: Secondary | ICD-10-CM

## 2017-08-08 DIAGNOSIS — F32A Depression, unspecified: Secondary | ICD-10-CM

## 2017-08-08 DIAGNOSIS — F329 Major depressive disorder, single episode, unspecified: Secondary | ICD-10-CM

## 2017-08-08 MED ORDER — LISINOPRIL-HYDROCHLOROTHIAZIDE 20-25 MG PO TABS: 1.0000 | ORAL_TABLET | Freq: Every day | ORAL | 1 refills | Status: DC

## 2017-08-08 NOTE — Telephone Encounter (Signed)
Lisinopril was sent in.   Dr. Etter Sjogren- please advise on:  Celexa: Last filled 12/14/16 Qty 70 Rf: 1 Alrpazolam: Last filled 01/31/17 Qty:60 Rf: 1

## 2017-08-08 NOTE — Telephone Encounter (Signed)
Refill celexa for 1 year Xanax x1-- 1 refill

## 2017-08-08 NOTE — Telephone Encounter (Signed)
Relation to pt: self Call back number:[phone] Pharmacy: [facility], [place] [phone] (Phone) [phone] (Fax)     Reason for call:  Patient requesting a 90 day supply citalopram (CELEXA) 40 MG tablet , lisinopril-hydrochlorothiazide (PRINZIDE,ZESTORETIC) 20-25 MG tablet and ALPRAZolam (XANAX) 0.5 MG tablet

## 2017-08-09 MED ORDER — CITALOPRAM HYDROBROMIDE 40 MG PO TABS: 40.0000 mg | ORAL_TABLET | Freq: Every day | ORAL | 3 refills | Status: DC

## 2017-08-09 MED ORDER — ALPRAZOLAM 0.5 MG PO TABS: 0.5000 mg | ORAL_TABLET | Freq: Three times a day (TID) | ORAL | 1 refills | Status: DC | PRN

## 2017-08-09 NOTE — Telephone Encounter (Signed)
Rx faxed to [facility] pharmacy  

## 2017-08-09 NOTE — Telephone Encounter (Signed)
Rx's refilled. Rx for Alprazolam printed, awaiting DO signature.

## 2018-02-19 ENCOUNTER — Other Ambulatory Visit: Payer: Self-pay | Admitting: Family Medicine

## 2018-02-19 DIAGNOSIS — I10 Essential (primary) hypertension: Secondary | ICD-10-CM

## 2018-02-21 ENCOUNTER — Encounter: Payer: Self-pay | Admitting: Family Medicine

## 2018-02-21 ENCOUNTER — Ambulatory Visit (INDEPENDENT_AMBULATORY_CARE_PROVIDER_SITE_OTHER): Payer: Self-pay | Admitting: Family Medicine

## 2018-02-21 VITALS — BP 136/70 | HR 82 | Temp 98.2°F | Resp 16 | Ht 61.0 in | Wt 209.2 lb

## 2018-02-21 DIAGNOSIS — I10 Essential (primary) hypertension: Secondary | ICD-10-CM

## 2018-02-21 DIAGNOSIS — R002 Palpitations: Secondary | ICD-10-CM

## 2018-02-21 DIAGNOSIS — F411 Generalized anxiety disorder: Secondary | ICD-10-CM

## 2018-02-21 MED ORDER — ALPRAZOLAM 0.5 MG PO TABS: 0.5000 mg | ORAL_TABLET | Freq: Three times a day (TID) | ORAL | 1 refills | Status: DC | PRN

## 2018-02-21 MED ORDER — LISINOPRIL-HYDROCHLOROTHIAZIDE 20-25 MG PO TABS: 1.0000 | ORAL_TABLET | Freq: Every day | ORAL | 1 refills | Status: DC

## 2018-02-21 NOTE — Patient Instructions (Signed)
DASH Eating Plan DASH stands for "Dietary Approaches to Stop Hypertension." The DASH eating plan is a healthy eating plan that has been shown to reduce high blood pressure (hypertension). It may also reduce your risk for type 2 diabetes, heart disease, and stroke. The DASH eating plan may also help with weight loss. What are tips for following this plan? General guidelines  Avoid eating more than 2,300 mg (milligrams) of salt (sodium) a day. If you have hypertension, you may need to reduce your sodium intake to 1,500 mg a day.  Limit alcohol intake to no more than 1 drink a day for nonpregnant women and 2 drinks a day for men. One drink equals 12 oz of beer, 5 oz of wine, or 1 oz of hard liquor.  Work with your health care provider to maintain a healthy body weight or to lose weight. Ask what an ideal weight is for you.  Get at least 30 minutes of exercise that causes your heart to beat faster (aerobic exercise) most days of the week. Activities may include walking, swimming, or biking.  Work with your health care provider or diet and nutrition specialist (dietitian) to adjust your eating plan to your individual calorie needs. Reading food labels  Check food labels for the amount of sodium per serving. Choose foods with less than 5 percent of the Daily Value of sodium. Generally, foods with less than 300 mg of sodium per serving fit into this eating plan.  To find whole grains, look for the word "whole" as the first word in the ingredient list. Shopping  Buy products labeled as "low-sodium" or "no salt added."  Buy fresh foods. Avoid canned foods and premade or frozen meals. Cooking  Avoid adding salt when cooking. Use salt-free seasonings or herbs instead of table salt or sea salt. Check with your health care provider or pharmacist before using salt substitutes.  Do not fry foods. Cook foods using healthy methods such as baking, boiling, grilling, and broiling instead.  Cook with  heart-healthy oils, such as olive, canola, soybean, or sunflower oil. Meal planning   Eat a balanced diet that includes: ? 5 or more servings of fruits and vegetables each day. At each meal, try to fill half of your plate with fruits and vegetables. ? Up to 6-8 servings of whole grains each day. ? Less than 6 oz of lean meat, poultry, or fish each day. A 3-oz serving of meat is about the same size as a deck of cards. One egg equals 1 oz. ? 2 servings of low-fat dairy each day. ? A serving of nuts, seeds, or beans 5 times each week. ? Heart-healthy fats. Healthy fats called Omega-3 fatty acids are found in foods such as flaxseeds and coldwater fish, like sardines, salmon, and mackerel.  Limit how much you eat of the following: ? Canned or prepackaged foods. ? Food that is high in trans fat, such as fried foods. ? Food that is high in saturated fat, such as fatty meat. ? Sweets, desserts, sugary drinks, and other foods with added sugar. ? Full-fat dairy products.  Do not salt foods before eating.  Try to eat at least 2 vegetarian meals each week.  Eat more home-cooked food and less restaurant, buffet, and fast food.  When eating at a restaurant, ask that your food be prepared with less salt or no salt, if possible. What foods are recommended? The items listed may not be a complete list. Talk with your dietitian about what   dietary choices are best for you. Grains Whole-grain or whole-wheat bread. Whole-grain or whole-wheat pasta. Brown rice. Oatmeal. Quinoa. Bulgur. Whole-grain and low-sodium cereals. Pita bread. Low-fat, low-sodium crackers. Whole-wheat flour tortillas. Vegetables Fresh or frozen vegetables (raw, steamed, roasted, or grilled). Low-sodium or reduced-sodium tomato and vegetable juice. Low-sodium or reduced-sodium tomato sauce and tomato paste. Low-sodium or reduced-sodium canned vegetables. Fruits All fresh, dried, or frozen fruit. Canned fruit in natural juice (without  added sugar). Meat and other protein foods Skinless chicken or turkey. Ground chicken or turkey. Pork with fat trimmed off. Fish and seafood. Egg whites. Dried beans, peas, or lentils. Unsalted nuts, nut butters, and seeds. Unsalted canned beans. Lean cuts of beef with fat trimmed off. Low-sodium, lean deli meat. Dairy Low-fat (1%) or fat-free (skim) milk. Fat-free, low-fat, or reduced-fat cheeses. Nonfat, low-sodium ricotta or cottage cheese. Low-fat or nonfat yogurt. Low-fat, low-sodium cheese. Fats and oils Soft margarine without trans fats. Vegetable oil. Low-fat, reduced-fat, or light mayonnaise and salad dressings (reduced-sodium). Canola, safflower, olive, soybean, and sunflower oils. Avocado. Seasoning and other foods Herbs. Spices. Seasoning mixes without salt. Unsalted popcorn and pretzels. Fat-free sweets. What foods are not recommended? The items listed may not be a complete list. Talk with your dietitian about what dietary choices are best for you. Grains Baked goods made with fat, such as croissants, muffins, or some breads. Dry pasta or rice meal packs. Vegetables Creamed or fried vegetables. Vegetables in a cheese sauce. Regular canned vegetables (not low-sodium or reduced-sodium). Regular canned tomato sauce and paste (not low-sodium or reduced-sodium). Regular tomato and vegetable juice (not low-sodium or reduced-sodium). Pickles. Olives. Fruits Canned fruit in a light or heavy syrup. Fried fruit. Fruit in cream or butter sauce. Meat and other protein foods Fatty cuts of meat. Ribs. Fried meat. Bacon. Sausage. Bologna and other processed lunch meats. Salami. Fatback. Hotdogs. Bratwurst. Salted nuts and seeds. Canned beans with added salt. Canned or smoked fish. Whole eggs or egg yolks. Chicken or turkey with skin. Dairy Whole or 2% milk, cream, and half-and-half. Whole or full-fat cream cheese. Whole-fat or sweetened yogurt. Full-fat cheese. Nondairy creamers. Whipped toppings.  Processed cheese and cheese spreads. Fats and oils Butter. Stick margarine. Lard. Shortening. Ghee. Bacon fat. Tropical oils, such as coconut, palm kernel, or palm oil. Seasoning and other foods Salted popcorn and pretzels. Onion salt, garlic salt, seasoned salt, table salt, and sea salt. Worcestershire sauce. Tartar sauce. Barbecue sauce. Teriyaki sauce. Soy sauce, including reduced-sodium. Steak sauce. Canned and packaged gravies. Fish sauce. Oyster sauce. Cocktail sauce. Horseradish that you find on the shelf. Ketchup. Mustard. Meat flavorings and tenderizers. Bouillon cubes. Hot sauce and Tabasco sauce. Premade or packaged marinades. Premade or packaged taco seasonings. Relishes. Regular salad dressings. Where to find more information:  National Heart, Lung, and Blood Institute: www.nhlbi.nih.gov  American Heart Association: www.heart.org Summary  The DASH eating plan is a healthy eating plan that has been shown to reduce high blood pressure (hypertension). It may also reduce your risk for type 2 diabetes, heart disease, and stroke.  With the DASH eating plan, you should limit salt (sodium) intake to 2,300 mg a day. If you have hypertension, you may need to reduce your sodium intake to 1,500 mg a day.  When on the DASH eating plan, aim to eat more fresh fruits and vegetables, whole grains, lean proteins, low-fat dairy, and heart-healthy fats.  Work with your health care provider or diet and nutrition specialist (dietitian) to adjust your eating plan to your individual   calorie needs. This information is not intended to replace advice given to you by your health care provider. Make sure you discuss any questions you have with your health care provider. Document Released: 11/04/2011 Document Revised: 11/08/2016 Document Reviewed: 11/08/2016 Elsevier Interactive Patient Education  2018 Elsevier Inc.  

## 2018-02-21 NOTE — Progress Notes (Signed)
Patient ID: [name], female   DOB: June 28, 1971, 47 y.o.   MRN: [id number]    Subjective:  I acted as a Education administrator for Dr. Carollee Herter.  Guerry Bruin, [place]   Patient ID: [name], female    DOB: Apr 16, 1971, 47 y.o.   MRN: [id number]  Chief Complaint  Patient presents with  . Hypertension    HPI  Patient is in today for follow up blood pressure.  She c/o of some palpitations as well .  No chest pain.  They only occur occassionally Patient Care Team: Carollee Herter, Alferd Apa, DO as PCP - General (Family Medicine)   Past Medical History:  Diagnosis Date  . Anxiety   . H/O endometritis   . Hypertension     Past Surgical History:  Procedure Laterality Date  . ABDOMINAL HYSTERECTOMY  05/05/2011   TAH---fibroids and endometriosis  . CHOLECYSTECTOMY  02/2010    Family History  Problem Relation Age of Onset  . Diabetes Mother   . Kidney disease Mother   . Hypertension Father   . Heart disease Father   . Rheum arthritis Father   . Cancer Paternal Uncle        liver with mets   . Heart disease Paternal Grandmother   . Breast cancer Paternal Grandmother   . Osteoarthritis Paternal Grandfather     Social History   Socioeconomic History  . Marital status: Married    Spouse name: Not on file  . Number of children: Not on file  . Years of education: Not on file  . Highest education level: Not on file  Occupational History  . Occupation: Product manager: [employer]    Comment: [employer]  Social Needs  . Financial resource strain: Not on file  . Food insecurity:    Worry: Not on file    Inability: Not on file  . Transportation needs:    Medical: Not on file    Non-medical: Not on file  Tobacco Use  . Smoking status: Former Smoker    Last attempt to quit: 11/29/1988    Years since quitting: 29.2  . Smokeless tobacco: Never Used  Substance and Sexual Activity  . Alcohol use: No  . Drug use: No  . Sexual activity: Yes    Partners: Male    Lifestyle  . Physical activity:    Days per week: Not on file    Minutes per session: Not on file  . Stress: Not on file  Relationships  . Social connections:    Talks on phone: Not on file    Gets together: Not on file    Attends religious service: Not on file    Active member of club or organization: Not on file    Attends meetings of clubs or organizations: Not on file    Relationship status: Not on file  . Intimate partner violence:    Fear of current or ex partner: Not on file    Emotionally abused: Not on file    Physically abused: Not on file    Forced sexual activity: Not on file  Other Topics Concern  . Not on file  Social History Narrative  . Not on file    Outpatient Medications Prior to Visit  Medication Sig Dispense Refill  . ALPRAZolam (XANAX) 0.5 MG tablet Take 1 tablet (0.5 mg total) by mouth 3 (three) times daily as needed. 60 tablet 1  . lisinopril-hydrochlorothiazide (PRINZIDE,ZESTORETIC) 20-25 MG tablet Take 1 tablet  by mouth daily. 90 tablet 1  . azelastine (ASTELIN) 0.1 % nasal spray Place 2 sprays into both nostrils 2 (two) times daily. Use in each nostril as directed 30 mL 3  . citalopram (CELEXA) 40 MG tablet Take 1 tablet (40 mg total) by mouth daily. 90 tablet 3   No facility-administered medications prior to visit.     No Known Allergies  Review of Systems  Constitutional: Negative for fever and malaise/fatigue.  HENT: Negative for congestion.   Eyes: Negative for blurred vision.  Respiratory: Negative for cough and shortness of breath.   Cardiovascular: Negative for chest pain, palpitations and leg swelling.  Gastrointestinal: Negative for vomiting.  Musculoskeletal: Negative for back pain.  Skin: Negative for rash.  Neurological: Negative for loss of consciousness and headaches.       Objective:    Physical Exam  Constitutional: She is oriented to person, place, and time. She appears well-developed and well-nourished.  HENT:  Head:  Normocephalic and atraumatic.  Eyes: Conjunctivae and EOM are normal.  Neck: Normal range of motion. Neck supple. No JVD present. Carotid bruit is not present. No thyromegaly present.  Cardiovascular: Normal rate, regular rhythm and normal heart sounds.  No murmur heard. Pulmonary/Chest: Effort normal and breath sounds normal. No respiratory distress. She has no wheezes. She has no rales. She exhibits no tenderness.  Musculoskeletal: She exhibits no edema.  Neurological: She is alert and oriented to person, place, and time.  Psychiatric: She has a normal mood and affect. Her behavior is normal. Judgment and thought content normal.  Nursing note and vitals reviewed.   BP 136/70   Pulse 82   Temp 98.2 F (36.8 C) (Oral)   Resp 16   Ht 5\' 1"  (1.549 m)   Wt 209 lb 3.2 oz (94.9 kg)   SpO2 98%   BMI 39.53 kg/m  Wt Readings from Last 3 Encounters:  02/21/18 209 lb 3.2 oz (94.9 kg)  12/14/16 204 lb (92.5 kg)  10/27/16 202 lb (91.6 kg)   BP Readings from Last 3 Encounters:  02/21/18 136/70  12/14/16 122/80  10/27/16 100/60     Immunization History  Administered Date(s) Administered  . Hepatitis A 07/07/2007  . Hepatitis B 07/07/2007  . Influenza,inj,Quad PF,6+ Mos 01/26/2016, 12/14/2016  . Influenza-Unspecified 08/29/2013, 08/29/2014, 08/05/2017  . PPD Test 12/29/2010, 12/31/2010, 06/10/2014, 06/11/2016  . Td 07/07/2007    Health Maintenance  Topic Date Due  . HIV Screening  08/22/1985  . TETANUS/TDAP  07/06/2017  . MAMMOGRAM  07/08/2017  . INFLUENZA VACCINE  Completed    Lab Results  Component Value Date   WBC 7.7 10/20/2016   HGB 12.3 10/20/2016   HCT 35.6 (L) 10/20/2016   PLT 247.0 10/20/2016   GLUCOSE 99 10/27/2016   CHOL 146 01/26/2016   TRIG 106.0 01/26/2016   HDL 53.80 01/26/2016   LDLCALC 71 01/26/2016   ALT 28 10/27/2016   AST 22 10/27/2016   NA 140 10/27/2016   K 3.1 (L) 10/27/2016   CL 101 10/27/2016   CREATININE 0.82 10/27/2016   BUN 13 10/27/2016    CO2 32 10/27/2016   TSH 1.42 01/26/2016   HGBA1C 5.7 10/27/2016   MICROALBUR 2.8 (H) 08/31/2012    Lab Results  Component Value Date   TSH 1.42 01/26/2016   Lab Results  Component Value Date   WBC 7.7 10/20/2016   HGB 12.3 10/20/2016   HCT 35.6 (L) 10/20/2016   MCV 83.3 10/20/2016   PLT 247.0 10/20/2016  Lab Results  Component Value Date   NA 140 10/27/2016   K 3.1 (L) 10/27/2016   CO2 32 10/27/2016   GLUCOSE 99 10/27/2016   BUN 13 10/27/2016   CREATININE 0.82 10/27/2016   BILITOT 0.4 10/27/2016   ALKPHOS 125 (H) 10/27/2016   AST 22 10/27/2016   ALT 28 10/27/2016   PROT 6.7 10/27/2016   ALBUMIN 3.8 10/27/2016   CALCIUM 9.1 10/27/2016   GFR 79.71 10/27/2016   Lab Results  Component Value Date   CHOL 146 01/26/2016   Lab Results  Component Value Date   HDL 53.80 01/26/2016   Lab Results  Component Value Date   LDLCALC 71 01/26/2016   Lab Results  Component Value Date   TRIG 106.0 01/26/2016   Lab Results  Component Value Date   CHOLHDL 3 01/26/2016   Lab Results  Component Value Date   HGBA1C 5.7 10/27/2016      ekg --  Ectopic vent beat,  Sinus   Assessment & Plan:   Problem List Items Addressed This Visit      Unprioritized   HTN (hypertension)    Pt refused labs due to no ins [employer] med rto 2-3 weeks Schedule cpe      Relevant Medications   lisinopril-hydrochlorothiazide (PRINZIDE,ZESTORETIC) 20-25 MG tablet   Other Relevant Orders   EKG 12-Lead (Completed)    Other Visit Diagnoses    Palpitations    -  Primary   Relevant Orders   EKG 12-Lead (Completed)   Generalized anxiety disorder       Relevant Medications   ALPRAZolam (XANAX) 0.5 MG tablet      I have discontinued Mashell L. Cossey's azelastine and citalopram. I am also having her maintain her lisinopril-hydrochlorothiazide and ALPRAZolam.  Meds ordered this encounter  Medications  . lisinopril-hydrochlorothiazide (PRINZIDE,ZESTORETIC) 20-25 MG tablet    Sig:  Take 1 tablet by mouth daily.    Dispense:  90 tablet    Refill:  1  . DISCONTD: ALPRAZolam (XANAX) 0.5 MG tablet    Sig: Take 1 tablet (0.5 mg total) by mouth 3 (three) times daily as needed.    Dispense:  60 tablet    Refill:  1  . ALPRAZolam (XANAX) 0.5 MG tablet    Sig: Take 1 tablet (0.5 mg total) by mouth 3 (three) times daily as needed.    Dispense:  60 tablet    Refill:  1    CMA served as scribe during this visit. History, Physical and Plan performed by medical provider. Documentation and orders reviewed and attested to.  Ann Held, DO

## 2018-02-22 NOTE — Assessment & Plan Note (Signed)
Pt refused labs due to no ins [employer] med rto 2-3 weeks Schedule cpe

## 2018-03-13 ENCOUNTER — Ambulatory Visit (INDEPENDENT_AMBULATORY_CARE_PROVIDER_SITE_OTHER): Payer: Self-pay | Admitting: Medical

## 2018-03-13 ENCOUNTER — Encounter: Payer: Self-pay | Admitting: Medical

## 2018-03-13 VITALS — BP 121/82 | HR 96 | Temp 98.4°F | Resp 16 | Ht 61.0 in | Wt 197.6 lb

## 2018-03-13 DIAGNOSIS — J029 Acute pharyngitis, unspecified: Secondary | ICD-10-CM

## 2018-03-13 DIAGNOSIS — J020 Streptococcal pharyngitis: Secondary | ICD-10-CM

## 2018-03-13 DIAGNOSIS — R0981 Nasal congestion: Secondary | ICD-10-CM

## 2018-03-13 LAB — POCT RAPID STREP A (OFFICE): Rapid Strep A Screen: POSITIVE — AB

## 2018-03-13 MED ORDER — FLUTICASONE PROPIONATE 50 MCG/ACT NA SUSP: 2.0000 | Freq: Every day | NASAL | 1 refills | Status: DC

## 2018-03-13 MED ORDER — AMOXICILLIN 500 MG PO CAPS: 500.0000 mg | ORAL_CAPSULE | Freq: Three times a day (TID) | ORAL | 0 refills | Status: DC

## 2018-03-13 MED FILL — AMOXICILLIN 500 MG CAPSULE: 500 | 10 days supply | Qty: 30 | Fill #0

## 2018-03-13 NOTE — Patient Instructions (Signed)
Your strep test was positive. I am prescribing amoxicillin antibiotic. Rest hydrate, tylenol for fever and warm salt water gargles.   For nasal congestion recently, I am prescribing Flonase.  With severe pollen falling recently, nasal congestion could represent allergic rhinitis.  Follow-up in 7 days for any persisting symptoms or as needed

## 2018-03-13 NOTE — Progress Notes (Signed)
Subjective:    Patient ID: [name], female    DOB: 1971-06-15, 47 y.o.   MRN: [id number]  HPI  Saturday started feeling sick then she got severe st and then got body aches. Some fever and sweats.   ST severe and hurts to swallow anything.   Pt works as cna. Pt suspected she may have strep.     Review of Systems  Constitutional: Positive for fatigue and fever. Negative for chills.  HENT: Positive for congestion and sore throat. Negative for facial swelling, postnasal drip, sinus pressure and sinus pain.        Some nasal congestion just recenty.  Respiratory: Negative for cough, choking and wheezing.   Cardiovascular: Negative for chest pain and palpitations.  Gastrointestinal: Negative for abdominal pain.  Musculoskeletal: Positive for myalgias.  Skin: Negative for color change and rash.  Neurological: Negative for dizziness, light-headedness and headaches.  Hematological: Negative for adenopathy. Does not bruise/bleed easily.  Psychiatric/Behavioral: Negative for behavioral problems and confusion.    Past Medical History:  Diagnosis Date  . Anxiety   . H/O endometritis   . Hypertension      Social History   Socioeconomic History  . Marital status: Married    Spouse name: Not on file  . Number of children: Not on file  . Years of education: Not on file  . Highest education level: Not on file  Occupational History  . Occupation: Product manager: [employer]    Comment: [employer]  Social Needs  . Financial resource strain: Not on file  . Food insecurity:    Worry: Not on file    Inability: Not on file  . Transportation needs:    Medical: Not on file    Non-medical: Not on file  Tobacco Use  . Smoking status: Former Smoker    Last attempt to quit: 11/29/1988    Years since quitting: 29.3  . Smokeless tobacco: Never Used  Substance and Sexual Activity  . Alcohol use: No  . Drug use: No  . Sexual activity: Yes    Partners: Male   Lifestyle  . Physical activity:    Days per week: Not on file    Minutes per session: Not on file  . Stress: Not on file  Relationships  . Social connections:    Talks on phone: Not on file    Gets together: Not on file    Attends religious service: Not on file    Active member of club or organization: Not on file    Attends meetings of clubs or organizations: Not on file    Relationship status: Not on file  . Intimate partner violence:    Fear of current or ex partner: Not on file    Emotionally abused: Not on file    Physically abused: Not on file    Forced sexual activity: Not on file  Other Topics Concern  . Not on file  Social History Narrative  . Not on file    Past Surgical History:  Procedure Laterality Date  . ABDOMINAL HYSTERECTOMY  05/05/2011   TAH---fibroids and endometriosis  . CHOLECYSTECTOMY  02/2010    Family History  Problem Relation Age of Onset  . Diabetes Mother   . Kidney disease Mother   . Hypertension Father   . Heart disease Father   . Rheum arthritis Father   . Cancer Paternal Uncle        liver with mets   .  Heart disease Paternal Grandmother   . Breast cancer Paternal Grandmother   . Osteoarthritis Paternal Grandfather     No Known Allergies  Current Outpatient Medications on File Prior to Visit  Medication Sig Dispense Refill  . ALPRAZolam (XANAX) 0.5 MG tablet Take 1 tablet (0.5 mg total) by mouth 3 (three) times daily as needed. 60 tablet 1  . lisinopril-hydrochlorothiazide (PRINZIDE,ZESTORETIC) 20-25 MG tablet Take 1 tablet by mouth daily. 90 tablet 1   No current facility-administered medications on file prior to visit.     BP 121/82   Pulse 96   Temp 98.4 F (36.9 C) (Oral)   Resp 16   Ht 5\' 1"  (1.549 m)   Wt 197 lb 9.6 oz (89.6 kg)   SpO2 98%   BMI 37.34 kg/m       Objective:   Physical Exam  General  Mental Status - Alert. General Appearance - Well groomed. Not in acute distress.  Skin Rashes- No  Rashes.  HEENT Head- Normal. Ear Auditory Canal - Left- Normal. Right - Normal.Tympanic Membrane- Left- Normal. Right- Normal. Eye Sclera/Conjunctiva- Left- Normal. Right- Normal. Nose & Sinuses Nasal Mucosa- Left-  Boggy and Congested. Right-  Boggy and  Congested.Bilateral faint  maxillary but no frontal sinus pressure. Mouth & Throat Lips: Upper Lip- Normal: no dryness, cracking, pallor, cyanosis, or vesicular eruption. Lower Lip-Normal: no dryness, cracking, pallor, cyanosis or vesicular eruption. Buccal Mucosa- Bilateral- No Aphthous ulcers. Oropharynx- No Discharge or Erythema. Tonsils: Characteristics- Bilateral-Erythema and hypertrophy. Size/Enlargement- Bilateral- 2+ enlargement. Discharge- mild dc on rt side  Neck Neck- Supple. No Masses. Mild enlarged submandibular nodes.   Chest and Lung Exam Auscultation: Breath Sounds:-Clear even and unlabored.  Cardiovascular Auscultation:Rythm- Regular, rate and rhythm. Murmurs & Other Heart Sounds:Ausculatation of the heart reveal- No Murmurs.  Lymphatic Head & Neck General Head & Neck Lymphatics: Bilateral: Description- No Localized lymphadenopathy.       Assessment & Plan:  Your strep test was positive. I am prescribing amoxicillin antibiotic. Rest hydrate, tylenol for fever and warm salt water gargles.   For nasal congestion recently, I am prescribing Flonase.  With severe pollen falling recently, nasal congestion could represent allergic rhinitis.  Follow-up in 7 days for any persisting symptoms or as needed  [employer], PA-C

## 2018-04-21 ENCOUNTER — Encounter: Payer: Self-pay | Admitting: Family Medicine

## 2018-04-21 ENCOUNTER — Telehealth: Payer: Self-pay | Admitting: Family Medicine

## 2018-04-21 NOTE — Telephone Encounter (Signed)
Copied from [place] [id number]. Topic: Quick Communication - See Telephone Encounter >> Apr 21, 2018  1:16 PM Genella Rife H wrote: CRM for notification. See Telephone encounter for: 04/21/18.  Received fax stating pt wanted to cancel appt. Spoke with Dr. Carollee Herter and she thought maybe this was an error so called pt and left voicemail message for pt to call us back to make sure she wanted to cancel appt.

## 2018-04-27 NOTE — Telephone Encounter (Signed)
Pt is calling she was unable to get off work. Pt has a cpx sch for sept 2019

## 2018-06-01 ENCOUNTER — Encounter: Payer: Self-pay | Admitting: Family Medicine

## 2018-06-01 DIAGNOSIS — Z1231 Encounter for screening mammogram for malignant neoplasm of breast: Secondary | ICD-10-CM

## 2018-06-08 ENCOUNTER — Telehealth: Payer: Self-pay | Admitting: *Deleted

## 2018-06-08 NOTE — Telephone Encounter (Signed)
Copied from [place] [id number]. Topic: General - Other >> Jun 06, 2018 12:37 PM Mcneil, Ja-Kwan wrote: Reason for CRM: Pt states she would like to have the appt for a mammogram at a location in [facility]. Pt states she had a mammogram done downstairs [facility] and she would like to go there again if possible. Cb# [phone]

## 2018-06-12 NOTE — Telephone Encounter (Signed)
Patient notified she can just call.  Number given to patient to call.

## 2018-08-01 ENCOUNTER — Encounter (HOSPITAL_BASED_OUTPATIENT_CLINIC_OR_DEPARTMENT_OTHER): Payer: Self-pay

## 2018-08-01 ENCOUNTER — Ambulatory Visit (INDEPENDENT_AMBULATORY_CARE_PROVIDER_SITE_OTHER): Payer: BLUE CROSS/BLUE SHIELD | Admitting: Family Medicine

## 2018-08-01 ENCOUNTER — Ambulatory Visit (HOSPITAL_BASED_OUTPATIENT_CLINIC_OR_DEPARTMENT_OTHER)
Admission: RE | Admit: 2018-08-01 | Discharge: 2018-08-01 | Disposition: A | Discharge status: Home / Self Care | Payer: BLUE CROSS/BLUE SHIELD | Source: Ambulatory Visit | Attending: Family Medicine | Admitting: Family Medicine

## 2018-08-01 ENCOUNTER — Encounter: Payer: Self-pay | Admitting: Family Medicine

## 2018-08-01 VITALS — BP 110/62 | HR 69 | Temp 98.6°F | Resp 16 | Ht 61.0 in | Wt 190.2 lb

## 2018-08-01 DIAGNOSIS — G8929 Other chronic pain: Secondary | ICD-10-CM

## 2018-08-01 DIAGNOSIS — I10 Essential (primary) hypertension: Secondary | ICD-10-CM

## 2018-08-01 DIAGNOSIS — B354 Tinea corporis: Secondary | ICD-10-CM

## 2018-08-01 DIAGNOSIS — R937 Abnormal findings on diagnostic imaging of other parts of musculoskeletal system: Secondary | ICD-10-CM | POA: Insufficient documentation

## 2018-08-01 DIAGNOSIS — F418 Other specified anxiety disorders: Secondary | ICD-10-CM

## 2018-08-01 DIAGNOSIS — F411 Generalized anxiety disorder: Secondary | ICD-10-CM

## 2018-08-01 DIAGNOSIS — M25572 Pain in left ankle and joints of left foot: Secondary | ICD-10-CM

## 2018-08-01 DIAGNOSIS — Z1231 Encounter for screening mammogram for malignant neoplasm of breast: Secondary | ICD-10-CM | POA: Insufficient documentation

## 2018-08-01 DIAGNOSIS — M25562 Pain in left knee: Secondary | ICD-10-CM

## 2018-08-01 DIAGNOSIS — Z0000 Encounter for general adult medical examination without abnormal findings: Secondary | ICD-10-CM

## 2018-08-01 DIAGNOSIS — M769 Unspecified enthesopathy, lower limb, excluding foot: Secondary | ICD-10-CM | POA: Insufficient documentation

## 2018-08-01 DIAGNOSIS — Z79899 Other long term (current) drug therapy: Secondary | ICD-10-CM

## 2018-08-01 DIAGNOSIS — R928 Other abnormal and inconclusive findings on diagnostic imaging of breast: Secondary | ICD-10-CM | POA: Insufficient documentation

## 2018-08-01 DIAGNOSIS — M19072 Primary osteoarthritis, left ankle and foot: Secondary | ICD-10-CM | POA: Insufficient documentation

## 2018-08-01 DIAGNOSIS — Z23 Encounter for immunization: Secondary | ICD-10-CM

## 2018-08-01 DIAGNOSIS — M1712 Unilateral primary osteoarthritis, left knee: Secondary | ICD-10-CM | POA: Insufficient documentation

## 2018-08-01 MED ORDER — NYSTATIN 100000 UNIT/GM EX POWD: Freq: Four times a day (QID) | CUTANEOUS | 0 refills | Status: DC

## 2018-08-01 MED ORDER — ALPRAZOLAM 0.5 MG PO TABS: 0.5000 mg | ORAL_TABLET | Freq: Three times a day (TID) | ORAL | 1 refills | Status: DC | PRN

## 2018-08-01 MED ORDER — LISINOPRIL-HYDROCHLOROTHIAZIDE 20-25 MG PO TABS: 1.0000 | ORAL_TABLET | Freq: Every day | ORAL | 1 refills | Status: DC

## 2018-08-01 MED ORDER — FLUOXETINE HCL 10 MG PO CAPS: 10.0000 mg | ORAL_CAPSULE | Freq: Every day | ORAL | 3 refills | Status: DC

## 2018-08-01 NOTE — Progress Notes (Signed)
Subjective:     [name] is a 47 y.o. female and is here for a comprehensive physical exam. The patient reports problems - depression and anxiety.    L ankle and l knee are causing pain.    Social History   Socioeconomic History  . Marital status: Married    Spouse name: Not on file  . Number of children: Not on file  . Years of education: Not on file  . Highest education level: Not on file  Occupational History  . Occupation: Product manager: [employer]    Comment: [employer]  Social Needs  . Financial resource strain: Not on file  . Food insecurity:    Worry: Not on file    Inability: Not on file  . Transportation needs:    Medical: Not on file    Non-medical: Not on file  Tobacco Use  . Smoking status: Former Smoker    Last attempt to quit: 11/29/1988    Years since quitting: 29.6  . Smokeless tobacco: Never Used  Substance and Sexual Activity  . Alcohol use: Yes    Comment: rare wine use  . Drug use: No  . Sexual activity: Yes    Partners: Male  Lifestyle  . Physical activity:    Days per week: Not on file    Minutes per session: Not on file  . Stress: Not on file  Relationships  . Social connections:    Talks on phone: Not on file    Gets together: Not on file    Attends religious service: Not on file    Active member of club or organization: Not on file    Attends meetings of clubs or organizations: Not on file    Relationship status: Not on file  . Intimate partner violence:    Fear of current or ex partner: Not on file    Emotionally abused: Not on file    Physically abused: Not on file    Forced sexual activity: Not on file  Other Topics Concern  . Not on file  Social History Narrative  . Not on file   Health Maintenance  Topic Date Due  . TETANUS/TDAP  07/06/2017  . MAMMOGRAM  07/08/2017  . INFLUENZA VACCINE  06/29/2018  . HIV Screening  08/01/2024 (Originally [birth year]/1985)    The following portions of the patient's  history were reviewed and updated as appropriate:  She  has a past medical history of Anxiety, H/O endometritis, and Hypertension. She does not have any pertinent problems on file. She  has a past surgical history that includes Abdominal hysterectomy (05/05/2011) and Cholecystectomy (02/2010). Her family history includes Breast cancer in her paternal grandmother; Cancer in her paternal uncle; Diabetes in her mother; Heart disease in her father and paternal grandmother; Hypertension in her father; Kidney disease in her mother; Osteoarthritis in her paternal grandfather; Rheum arthritis in her father. She  reports that she quit smoking about 29 years ago. She has never used smokeless tobacco. She reports that she drinks alcohol. She reports that she does not use drugs. She has a current medication list which includes the following prescription(s): alprazolam, fluticasone, lisinopril-hydrochlorothiazide, fluoxetine, and nystatin. Current Outpatient Medications on File Prior to Visit  Medication Sig Dispense Refill  . fluticasone (FLONASE) 50 MCG/ACT nasal spray Place 2 sprays into both nostrils daily. 16 g 1   No current facility-administered medications on file prior to visit.    She has No Known Allergies.Marland Kitchen  Review of Systems Review of Systems  Constitutional: Negative for activity change, appetite change and fatigue.  HENT: Negative for hearing loss, congestion, tinnitus and ear discharge.  dentist q79m Eyes: Negative for visual disturbance (see optho q1y -- vision corrected to 20/20 with glasses).  Respiratory: Negative for cough, chest tightness and shortness of breath.   Cardiovascular: Negative for chest pain, palpitations and leg swelling.  Gastrointestinal: Negative for abdominal pain, diarrhea, constipation and abdominal distention.  Genitourinary: Negative for urgency, frequency, decreased urine volume and difficulty urinating.  Musculoskeletal: Negative for back pain, arthralgias and  gait problem.  Skin: Negative for color change, pallor and rash.  Neurological: Negative for dizziness, light-headedness, numbness and headaches.  Hematological: Negative for adenopathy. Does not bruise/bleed easily.  Psychiatric/Behavioral: Negative for suicidal ideas, confusion, sleep disturbance, self-injury, dysphoric mood, decreased concentration and agitation.       Objective:    BP 110/62 (BP Location: Left Arm, Cuff Size: Large)   Pulse 69   Temp 98.6 F (37 C) (Oral)   Resp 16   Ht 5\' 1"  (1.549 m)   Wt 190 lb 3.2 oz (86.3 kg)   SpO2 100%   BMI 35.94 kg/m  General appearance: alert, cooperative, appears stated age and no distress Head: Normocephalic, without obvious abnormality, atraumatic Eyes: conjunctivae/corneas clear. PERRL, EOM's intact. Fundi benign. Ears: normal TM's and external ear canals both ears Nose: Nares normal. Septum midline. Mucosa normal. No drainage or sinus tenderness. Throat: lips, mucosa, and tongue normal; teeth and gums normal Neck: no adenopathy, no carotid bruit, no JVD, supple, symmetrical, trachea midline and thyroid not enlarged, symmetric, no tenderness/mass/nodules Back: symmetric, no curvature. ROM normal. No CVA tenderness. Lungs: clear to auscultation bilaterally Breasts: normal appearance, no masses or tenderness Heart: regular rate and rhythm, S1, S2 normal, no murmur, click, rub or gallop Abdomen: soft, non-tender; bowel sounds normal; no masses,  no organomegaly Pelvic: not indicated; status post hysterectomy, negative ROS Extremities: extremities normal, atraumatic, no cyanosis or edema Pulses: 2+ and symmetric Skin: hyperpigmentation under breast c/w yeast Lymph nodes: Cervical, supraclavicular, and axillary nodes normal. Neurologic: Alert and oriented X 3, normal strength and tone. Normal symmetric reflexes. Normal coordination and gait    Assessment:    Healthy female exam.      Plan:    ghm utd Check labs  See After  Visit Summary for Counseling Recommendations    1. Preventative health care See above See avs ghm utd - CBC with Differential/Platelet - Comprehensive metabolic panel - Lipid panel - TSH  2. Generalized anxiety disorder stable - ALPRAZolam (XANAX) 0.5 MG tablet; Take 1 tablet (0.5 mg total) by mouth 3 (three) times daily as needed.  Dispense: 60 tablet; Refill: 1  3. Essential hypertension Well controlled, no changes to meds. Encouraged heart healthy diet such as the DASH diet and exercise as tolerated.  - lisinopril-hydrochlorothiazide (PRINZIDE,ZESTORETIC) 20-25 MG tablet; Take 1 tablet by mouth daily.  Dispense: 90 tablet; Refill: 1  4. Depression with anxiety Worsening  Add prozac -- f/u 1 month or sooner prn  - FLUoxetine (PROZAC) 10 MG capsule; Take 1 capsule (10 mg total) by mouth daily.  Dispense: 30 capsule; Refill: 3 - Pain Mgmt, Profile 8 w/Conf, U  5. Chronic pain of left ankle Check xray Refer to sport med - Ambulatory referral to Sports Medicine - DG Knee Complete 4 Views Left; Future - DG Ankle Complete Left; Future  6. Chronic pain of left knee  - Ambulatory referral to Sports Medicine -  DG Knee Complete 4 Views Left; Future - DG Ankle Complete Left; Future  7. Tinea corporis  - nystatin (MYCOSTATIN/NYSTOP) powder; Apply topically 4 (four) times daily.  Dispense: 15 g; Refill: 0  8. High risk medication use  - Pain Mgmt, Profile 8 w/Conf, U  9. Need for Tdap vaccination  - Tdap vaccine greater than or equal to 7yo IM

## 2018-08-01 NOTE — Patient Instructions (Signed)
Preventive Care 40-64 Years, Female Preventive care refers to lifestyle choices and visits with your health care provider that can promote health and wellness. What does preventive care include?  A yearly physical exam. This is also called an annual well check.  Dental exams once or twice a year.  Routine eye exams. Ask your health care provider how often you should have your eyes checked.  Personal lifestyle choices, including: ? Daily care of your teeth and gums. ? Regular physical activity. ? Eating a healthy diet. ? Avoiding tobacco and drug use. ? Limiting alcohol use. ? Practicing safe sex. ? Taking low-dose aspirin daily starting at age 58. ? Taking vitamin and mineral supplements as recommended by your health care provider. What happens during an annual well check? The services and screenings done by your health care provider during your annual well check will depend on your age, overall health, lifestyle risk factors, and family history of disease. Counseling Your health care provider may ask you questions about your:  Alcohol use.  Tobacco use.  Drug use.  Emotional well-being.  Home and relationship well-being.  Sexual activity.  Eating habits.  Work and work Statistician.  Method of birth control.  Menstrual cycle.  Pregnancy history.  Screening You may have the following tests or measurements:  Height, weight, and BMI.  Blood pressure.  Lipid and cholesterol levels. These may be checked every 5 years, or more frequently if you are over 81 years old.  Skin check.  Lung cancer screening. You may have this screening every year starting at age 78 if you have a 30-pack-year history of smoking and currently smoke or have quit within the past 15 years.  Fecal occult blood test (FOBT) of the stool. You may have this test every year starting at age 65.  Flexible sigmoidoscopy or colonoscopy. You may have a sigmoidoscopy every 5 years or a colonoscopy  every 10 years starting at age 30.  Hepatitis C blood test.  Hepatitis B blood test.  Sexually transmitted disease (STD) testing.  Diabetes screening. This is done by checking your blood sugar (glucose) after you have not eaten for a while (fasting). You may have this done every 1-3 years.  Mammogram. This may be done every 1-2 years. Talk to your health care provider about when you should start having regular mammograms. This may depend on whether you have a family history of breast cancer.  BRCA-related cancer screening. This may be done if you have a family history of breast, ovarian, tubal, or peritoneal cancers.  Pelvic exam and Pap test. This may be done every 3 years starting at age 80. Starting at age 36, this may be done every 5 years if you have a Pap test in combination with an HPV test.  Bone density scan. This is done to screen for osteoporosis. You may have this scan if you are at high risk for osteoporosis.  Discuss your test results, treatment options, and if necessary, the need for more tests with your health care provider. Vaccines Your health care provider may recommend certain vaccines, such as:  Influenza vaccine. This is recommended every year.  Tetanus, diphtheria, and acellular pertussis (Tdap, Td) vaccine. You may need a Td booster every 10 years.  Varicella vaccine. You may need this if you have not been vaccinated.  Zoster vaccine. You may need this after age 5.  Measles, mumps, and rubella (MMR) vaccine. You may need at least one dose of MMR if you were born in  1957 or later. You may also need a second dose.  Pneumococcal 13-valent conjugate (PCV13) vaccine. You may need this if you have certain conditions and were not previously vaccinated.  Pneumococcal polysaccharide (PPSV23) vaccine. You may need one or two doses if you smoke cigarettes or if you have certain conditions.  Meningococcal vaccine. You may need this if you have certain  conditions.  Hepatitis A vaccine. You may need this if you have certain conditions or if you travel or work in places where you may be exposed to hepatitis A.  Hepatitis B vaccine. You may need this if you have certain conditions or if you travel or work in places where you may be exposed to hepatitis B.  Haemophilus influenzae type b (Hib) vaccine. You may need this if you have certain conditions.  Talk to your health care provider about which screenings and vaccines you need and how often you need them. This information is not intended to replace advice given to you by your health care provider. Make sure you discuss any questions you have with your health care provider. Document Released: 12/12/2015 Document Revised: 08/04/2016 Document Reviewed: 09/16/2015 Elsevier Interactive Patient Education  2018 Elsevier Inc.  

## 2018-08-02 ENCOUNTER — Other Ambulatory Visit: Payer: Self-pay | Admitting: Family Medicine

## 2018-08-02 DIAGNOSIS — R928 Other abnormal and inconclusive findings on diagnostic imaging of breast: Secondary | ICD-10-CM

## 2018-08-02 LAB — LIPID PANEL
CHOLESTEROL: 146 mg/dL (ref 0–200)
HDL: 57.7 mg/dL (ref >39.00)
LDL CALC: 76 mg/dL (ref 0–99)
NonHDL: 87.94
TRIGLYCERIDES: 59 mg/dL (ref 0.0–149.0)
Total CHOL/HDL Ratio: 3
VLDL: 11.8 mg/dL (ref 0.0–40.0)

## 2018-08-02 LAB — TSH: TSH: 1.51 u[IU]/mL (ref 0.35–4.50)

## 2018-08-02 LAB — CBC WITH DIFFERENTIAL/PLATELET
Basophils Absolute: 0.1 10*3/uL (ref 0.0–0.1)
Basophils Relative: 1.3 % (ref 0.0–3.0)
Eosinophils Absolute: 0.2 10*3/uL (ref 0.0–0.7)
Eosinophils Relative: 2.6 % (ref 0.0–5.0)
HCT: 38.4 % (ref 36.0–46.0)
Hemoglobin: 13.2 g/dL (ref 12.0–15.0)
Lymphocytes Relative: 30.3 % (ref 12.0–46.0)
Lymphs Abs: 2.6 10*3/uL (ref 0.7–4.0)
MCHC: 34.4 g/dL (ref 30.0–36.0)
MCV: 83.2 fl (ref 78.0–100.0)
Monocytes Absolute: 0.4 10*3/uL (ref 0.1–1.0)
Monocytes Relative: 5.1 % (ref 3.0–12.0)
Neutro Abs: 5.2 10*3/uL (ref 1.4–7.7)
Neutrophils Relative %: 60.7 % (ref 43.0–77.0)
Platelets: 299 10*3/uL (ref 150.0–400.0)
RBC: 4.62 Mil/uL (ref 3.87–5.11)
RDW: 13.8 % (ref 11.5–15.5)
WBC: 8.5 10*3/uL (ref 4.0–10.5)

## 2018-08-02 LAB — COMPREHENSIVE METABOLIC PANEL
ALT: 31 U/L (ref 0–35)
AST: 24 U/L (ref 0–37)
Albumin: 4.3 g/dL (ref 3.5–5.2)
Alkaline Phosphatase: 111 U/L (ref 39–117)
BUN: 18 mg/dL (ref 6–23)
CO2: 31 mEq/L (ref 19–32)
Calcium: 10.1 mg/dL (ref 8.4–10.5)
Chloride: 100 mEq/L (ref 96–112)
Creatinine, Ser: 0.84 mg/dL (ref 0.40–1.20)
GFR: 76.93 mL/min (ref >60.00)
Glucose, Bld: 81 mg/dL (ref 70–99)
Potassium: 3.3 mEq/L — ABNORMAL LOW (ref 3.5–5.1)
Sodium: 141 mEq/L (ref 135–145)
Total Bilirubin: 0.5 mg/dL (ref 0.2–1.2)
Total Protein: 7.4 g/dL (ref 6.0–8.3)

## 2018-08-03 ENCOUNTER — Telehealth: Payer: Self-pay | Admitting: *Deleted

## 2018-08-03 NOTE — Telephone Encounter (Signed)
Received Physician Orders from [place]; forwarded to provider/SLS 09/05

## 2018-08-04 LAB — PAIN MGMT, PROFILE 8 W/CONF, U
6 Acetylmorphine: NEGATIVE ng/mL (ref <10)
Alcohol Metabolites: NEGATIVE ng/mL (ref <500)
Alphahydroxyalprazolam: 46 ng/mL — ABNORMAL HIGH (ref <25)
Alphahydroxymidazolam: NEGATIVE ng/mL (ref <50)
Alphahydroxytriazolam: NEGATIVE ng/mL (ref <50)
Aminoclonazepam: NEGATIVE ng/mL (ref <25)
Amphetamines: NEGATIVE ng/mL (ref <500)
Benzodiazepines: POSITIVE ng/mL — AB (ref <100)
Buprenorphine, Urine: NEGATIVE ng/mL (ref <5)
Cocaine Metabolite: NEGATIVE ng/mL (ref <150)
Creatinine: 106.2 mg/dL
Hydroxyethylflurazepam: NEGATIVE ng/mL (ref <50)
Lorazepam: NEGATIVE ng/mL (ref <50)
MDMA: NEGATIVE ng/mL (ref <500)
Marijuana Metabolite: NEGATIVE ng/mL (ref <20)
Nordiazepam: NEGATIVE ng/mL (ref <50)
Opiates: NEGATIVE ng/mL (ref <100)
Oxazepam: NEGATIVE ng/mL (ref <50)
Oxidant: NEGATIVE ug/mL (ref <200)
Oxycodone: NEGATIVE ng/mL (ref <100)
Temazepam: NEGATIVE ng/mL (ref <50)
pH: 5.38 (ref 4.5–9.0)

## 2018-08-21 ENCOUNTER — Encounter: Payer: Self-pay | Admitting: Family Medicine

## 2018-08-29 ENCOUNTER — Encounter: Payer: Self-pay | Admitting: Family Medicine

## 2018-08-29 ENCOUNTER — Ambulatory Visit: Payer: BLUE CROSS/BLUE SHIELD | Admitting: Family Medicine

## 2018-08-29 ENCOUNTER — Telehealth: Payer: Self-pay | Admitting: Family Medicine

## 2018-08-29 NOTE — Telephone Encounter (Signed)
Copied from [place] [id number]. Topic: Quick Communication - See Telephone Encounter >> Aug 29, 2018  2:19 PM [name] wrote: CRM for notification. See Telephone encounter for: 08/29/18.  Patient wanted to let Dr Etter Sjogren know she is doing good with the FLUoxetine (PROZAC) 10 MG capsule. She wants to know does she still need to keep her appointment for 10/8 for this? Please advise.

## 2018-08-31 NOTE — Telephone Encounter (Signed)
Patient notified to keep appointment.

## 2018-09-05 ENCOUNTER — Ambulatory Visit: Payer: BLUE CROSS/BLUE SHIELD | Admitting: Family Medicine

## 2018-09-05 ENCOUNTER — Encounter: Payer: Self-pay | Admitting: Family Medicine

## 2018-09-05 VITALS — BP 102/66 | HR 68 | Temp 98.5°F | Resp 16 | Ht 61.0 in | Wt 194.0 lb

## 2018-09-05 DIAGNOSIS — Z23 Encounter for immunization: Secondary | ICD-10-CM

## 2018-09-05 DIAGNOSIS — F418 Other specified anxiety disorders: Secondary | ICD-10-CM

## 2018-09-05 NOTE — Assessment & Plan Note (Signed)
Flu shot given

## 2018-09-05 NOTE — Patient Instructions (Signed)

## 2018-09-05 NOTE — Progress Notes (Signed)
Patient ID: [name], female    DOB: Sep 17, 1971  Age: 47 y.o. MRN: [id number]    Subjective:  Subjective  HPI [name] presefnts for f/u depression and she would like her flu shot.  She is doing really well with the prozac.  No complaints.   Review of Systems  Constitutional: Negative for appetite change, diaphoresis, fatigue and unexpected weight change.  Eyes: Negative for pain, redness and visual disturbance.  Respiratory: Negative for cough, chest tightness, shortness of breath and wheezing.   Cardiovascular: Negative for chest pain, palpitations and leg swelling.  Endocrine: Negative for cold intolerance, heat intolerance, polydipsia, polyphagia and polyuria.  Genitourinary: Negative for difficulty urinating, dysuria and frequency.  Neurological: Negative for dizziness, light-headedness, numbness and headaches.  Psychiatric/Behavioral: Negative for decreased concentration and dysphoric mood. The patient is not nervous/anxious.     History Past Medical History:  Diagnosis Date  . Anxiety   . H/O endometritis   . Hypertension     She has a past surgical history that includes Abdominal hysterectomy (05/05/2011) and Cholecystectomy (02/2010).   Her family history includes Breast cancer in her paternal grandmother; Cancer in her paternal uncle; Diabetes in her mother; Heart disease in her father and paternal grandmother; Hypertension in her father; Kidney disease in her mother; Osteoarthritis in her paternal grandfather; Rheum arthritis in her father.She reports that she quit smoking about 29 years ago. She has never used smokeless tobacco. She reports that she drinks alcohol. She reports that she does not use drugs.  Current Outpatient Medications on File Prior to Visit  Medication Sig Dispense Refill  . ALPRAZolam (XANAX) 0.5 MG tablet Take 1 tablet (0.5 mg total) by mouth 3 (three) times daily as needed. 60 tablet 1  . FLUoxetine (PROZAC) 10 MG capsule Take 1  capsule (10 mg total) by mouth daily. 30 capsule 3  . fluticasone (FLONASE) 50 MCG/ACT nasal spray Place 2 sprays into both nostrils daily. 16 g 1  . lisinopril-hydrochlorothiazide (PRINZIDE,ZESTORETIC) 20-25 MG tablet Take 1 tablet by mouth daily. 90 tablet 1  . nystatin (MYCOSTATIN/NYSTOP) powder Apply topically 4 (four) times daily. 15 g 0   No current facility-administered medications on file prior to visit.      Objective:  Objective  Physical Exam  Constitutional: She is oriented to person, place, and time. She appears well-developed and well-nourished.  HENT:  Head: Normocephalic and atraumatic.  Eyes: Conjunctivae and EOM are normal.  Neck: Normal range of motion. Neck supple. No JVD present. Carotid bruit is not present. No thyromegaly present.  Cardiovascular: Normal rate, regular rhythm and normal heart sounds.  No murmur heard. Pulmonary/Chest: Effort normal and breath sounds normal. No respiratory distress. She has no wheezes. She has no rales. She exhibits no tenderness.  Musculoskeletal: She exhibits no edema.  Neurological: She is alert and oriented to person, place, and time.  Psychiatric: She has a normal mood and affect. Her behavior is normal. Judgment and thought content normal.  Nursing note and vitals reviewed.  BP 102/66 (BP Location: Left Arm, Cuff Size: Normal)   Pulse 68   Temp 98.5 F (36.9 C) (Oral)   Resp 16   Ht 5\' 1"  (1.549 m)   Wt 194 lb (88 kg)   SpO2 98%   BMI 36.66 kg/m  Wt Readings from Last 3 Encounters:  09/05/18 194 lb (88 kg)  08/01/18 190 lb 3.2 oz (86.3 kg)  03/13/18 197 lb 9.6 oz (89.6 kg)     Lab Results  Component Value Date   WBC 8.5 08/01/2018   HGB 13.2 08/01/2018   HCT 38.4 08/01/2018   PLT 299.0 08/01/2018   GLUCOSE 81 08/01/2018   CHOL 146 08/01/2018   TRIG 59.0 08/01/2018   HDL 57.70 08/01/2018   LDLCALC 76 08/01/2018   ALT 31 08/01/2018   AST 24 08/01/2018   NA 141 08/01/2018   K 3.3 (L) 08/01/2018   CL 100  08/01/2018   CREATININE 0.84 08/01/2018   BUN 18 08/01/2018   CO2 31 08/01/2018   TSH 1.51 08/01/2018   HGBA1C 5.7 10/27/2016   MICROALBUR 2.8 (H) 08/31/2012    Dg Ankle Complete Left  Result Date: 08/02/2018 CLINICAL DATA:  Chronic ankle pain, swelling.  History of fracture. EXAM: LEFT ANKLE COMPLETE - 3+ VIEW COMPARISON:  None. FINDINGS: Mild medial soft tissue swelling. Remote fracture of the lateral malleolus. Suspect a remote fracture of the medial malleolus is well. There is a small lucency in the lateral talar dome, with surrounding sclerosis. Degenerative changes in the tibiotalar joint and midfoot. Calcaneal spurs. IMPRESSION: 1. Suspect an osteochondral defect in the lateral talar dome. 2. Ankle and midfoot osteoarthritis. Electronically Signed   By: Lorin Picket M.D.   On: 08/02/2018 08:53   Dg Knee Complete 4 Views Left  Result Date: 08/02/2018 CLINICAL DATA:  47 year old with chronic anterior left knee pain. EXAM: LEFT KNEE - COMPLETE 4+ VIEW COMPARISON:  None. FINDINGS: Left knee is located without a fracture or large joint effusion. Enthesopathic changes involving the patella and tibial tuberosity. Mild medial joint space narrowing. Mild spurring in the patellofemoral compartment of the knee. IMPRESSION: Mild osteoarthritis in left knee without acute bone abnormality. Enthesopathic changes involving the patella and tibial tuberosity. Electronically Signed   By: Markus Daft M.D.   On: 08/02/2018 08:52   Mm 3d Screen Breast Bilateral  Result Date: 08/01/2018 CLINICAL DATA:  Screening. EXAM: DIGITAL SCREENING BILATERAL MAMMOGRAM WITH TOMO AND CAD COMPARISON:  Previous exam(s). ACR Breast Density Category b: There are scattered areas of fibroglandular density. FINDINGS: In the right breast a group of calcifications and a separate asymmetry require further evaluation. In the left breast subtle distortion requires further evaluation. Images were processed with CAD. IMPRESSION: Further  evaluation is suggested for possible a group of calcifications and a separate asymmetry in the right breast. Further evaluation is suggested for possible subtle distortion in the left breast. RECOMMENDATION: Diagnostic mammogram and possibly ultrasound of both breasts. (Code:[id number]) The patient will be contacted regarding the findings, and additional imaging will be scheduled. BI-RADS CATEGORY  0: Incomplete. Need additional imaging evaluation and/or prior mammograms for comparison. Electronically Signed   By: Fidela Salisbury M.D.   On: 08/01/2018 15:33     Assessment & Plan:  Plan  I am having [name] maintain her fluticasone, ALPRAZolam, lisinopril-hydrochlorothiazide, FLUoxetine, and nystatin.  No orders of the defined types were placed in this encounter.   Problem List Items Addressed This Visit      Unprioritized   Need for influenza vaccination    Flu shot given       Other Visit Diagnoses    Depression with anxiety    -  Primary    con't prozac F/u 3 moths  Follow-up: Return in about 3 months (around 12/06/2018).  Ann Held, DO

## 2018-09-06 NOTE — Addendum Note (Signed)
Addended by: Kem Boroughs D on: 09/06/2018 08:28 AM   Modules accepted: Orders

## 2018-10-04 ENCOUNTER — Other Ambulatory Visit: Payer: BLUE CROSS/BLUE SHIELD

## 2018-10-07 ENCOUNTER — Encounter: Payer: Self-pay | Admitting: Family Medicine

## 2018-10-10 NOTE — Telephone Encounter (Signed)
Noted  

## 2018-10-23 ENCOUNTER — Encounter: Payer: Self-pay | Admitting: *Deleted

## 2018-12-04 ENCOUNTER — Other Ambulatory Visit: Payer: Self-pay | Admitting: Family Medicine

## 2018-12-04 DIAGNOSIS — F418 Other specified anxiety disorders: Secondary | ICD-10-CM

## 2019-01-17 ENCOUNTER — Other Ambulatory Visit: Payer: Self-pay | Admitting: Family Medicine

## 2019-01-17 DIAGNOSIS — F418 Other specified anxiety disorders: Secondary | ICD-10-CM

## 2019-01-29 ENCOUNTER — Ambulatory Visit: Payer: Self-pay | Admitting: Family Medicine

## 2019-01-29 NOTE — Telephone Encounter (Signed)
She called in c/o her "heart racing" over the weekend and her BP has been elevated.   She then mentioned she was having a tightness in her chest right now in the center of her chest and at times to the left side of her chest. Her mother had a heart transplant and her father has had cardiac issues too so she has a strong family history which she was very concerned about.   She is also diabetic.  See triage notes.   I have referred her to the ED.   She is going to the ED at [facility] now.  I will send these triage notes to Dr. Carollee Herter to make her aware.    Reason for Disposition . [1] Chest pain lasts > 5 minutes AND [2] age > 67 AND [3] at least one cardiac risk factor (i.e., hypertension, diabetes, obesity, smoker or strong family history of heart disease)    Diabetic and both parents with a strong cardiac history.  Answer Assessment - Initial Assessment Questions 1. DESCRIPTION: "Please describe your heart rate or heart beat that you are having" (e.g., fast/slow, regular/irregular, skipped or extra beats, "palpitations")     My heart feels like it's racing all weekend.   When they checked my BP this morning it was 158/98 here at [facility] where I work.    They told me my heart was "racing".   I'm taking my BP medication. 2. ONSET: "When did it start?" (Minutes, hours or days)       I've had a headache for a couple of weeks.   It's been racing for a couple of weeks.   My chest is hurting too. 3. DURATION: "How long does it last" (e.g., seconds, minutes, hours)     My chest is tight right in the middle.   Sometimes to the left side.   Both my parents have heart problems.   I'm having a little chest tightness right now.   I had some pain in my back in the left lower side last week. 4. PATTERN "Does it come and go, or has it been constant since it started?"  "Does it get worse with exertion?"   "Are you feeling it now?"     It comes and goes.   5. TAP: "Using your hand, can  you tap out what you are feeling on a chair or table in front of you, so that I can hear?" (Note: not all patients can do this)       It's racing. 6. HEART RATE: "Can you tell me your heart rate?" "How many beats in 15 seconds?"  (Note: not all patients can do this)       I don't know.  7. RECURRENT SYMPTOM: "Have you ever had this before?" If so, ask: "When was the last time?" and "What happened that time?"      Not the chest tightness before.   I have had an elevated BP and she increased your BP medication.     I'm very tired too. 8. CAUSE: "What do you think is causing the palpitations?"     I don't know.   It starts even when I'm at rest. 9. CARDIAC HISTORY: "Do you have any history of heart disease?" (e.g., heart attack, angina, bypass surgery, angioplasty, arrhythmia)      No but both of my parents do. 10. OTHER SYMPTOMS: "Do you have any other symptoms?" (e.g., dizziness, chest pain, sweating, difficulty breathing)  If I get up too fast or move my head quickly, I have to take a deep breath like I just need more air.   This morning about 10 minutes ago 95%.    11. PREGNANCY: "Is there any chance you are pregnant?" "When was your last menstrual period?"       Hysterectomy  Protocols used: CHEST PAIN-A-AH, HEART RATE AND HEARTBEAT QUESTIONS-A-AH

## 2019-02-16 ENCOUNTER — Other Ambulatory Visit: Payer: Self-pay | Admitting: Family Medicine

## 2019-02-16 DIAGNOSIS — I10 Essential (primary) hypertension: Secondary | ICD-10-CM

## 2019-03-02 ENCOUNTER — Encounter: Payer: Self-pay | Admitting: Family Medicine

## 2019-03-02 ENCOUNTER — Other Ambulatory Visit: Payer: Self-pay

## 2019-03-02 ENCOUNTER — Ambulatory Visit (INDEPENDENT_AMBULATORY_CARE_PROVIDER_SITE_OTHER): Payer: Self-pay | Admitting: Family Medicine

## 2019-03-02 ENCOUNTER — Ambulatory Visit: Payer: Self-pay

## 2019-03-02 DIAGNOSIS — Z7189 Other specified counseling: Secondary | ICD-10-CM

## 2019-03-02 DIAGNOSIS — J40 Bronchitis, not specified as acute or chronic: Secondary | ICD-10-CM

## 2019-03-02 MED ORDER — AZITHROMYCIN 250 MG PO TABS: ORAL_TABLET | ORAL | 0 refills | Status: DC

## 2019-03-02 NOTE — Telephone Encounter (Signed)
Received message late. Called patient to see if she couud do Web visit. States they have just completed Web visit.

## 2019-03-02 NOTE — Telephone Encounter (Signed)
Happy to do webex if she needs to talk to me i'll send 20 mg to pharmacy

## 2019-03-02 NOTE — Telephone Encounter (Signed)
May we can do a web ex now instead of 230

## 2019-03-02 NOTE — Telephone Encounter (Signed)
Pt c/o SOB at rest, headache,chills, dry cough, fatigue. Pt calling from work. Pt h/io HTN. Pt having frequent cough and c/o tickle in the back of her throat. No travel international or domestic, no exposure to positive Covid pt's. Pt stated every day she has new symptoms. Pt c./o chest feeling tight. Pt stated she was wheezing this morning. Called [facility] and transferred call to her.   Reason for Disposition . MILD difficulty breathing (e.g., minimal/no SOB at rest, SOB with walking, pulse <100)  Answer Assessment - Initial Assessment Questions 1. COVID-19 DIAGNOSIS: "Who made your Coronavirus (COVID-19) diagnosis?" "Was it confirmed by a positive lab test?" If not diagnosed by a HCP, ask "Are there lots of cases (community spread) where you live?" (See public health department website, if unsure)   * MAJOR community spread: high number of cases; numbers of cases are increasing; many people hospitalized.   * MINOR community spread: low number of cases; not increasing; few or no people hospitalized     *No Answer* 2. ONSET: "When did the COVID-19 symptoms start?"      sunday 3. WORST SYMPTOM: "What is your worst symptom?" (e.g., cough, fever, shortness of breath, muscle aches)     Fatigue, SOB at rest cant get a deep enough breath 4. COUGH: "How bad is the cough?"       Dry cough frequent cough 5. FEVER: "Do you have a fever?" If so, ask: "What is your temperature, how was it measured, and when did it start?"     No- chills 6. RESPIRATORY STATUS: "Describe your breathing?" (e.g., shortness of breath, wheezing, unable to speak)      SOB at rest, wheezing this morning 7. BETTER-SAME-WORSE: "Are you getting better, staying the same or getting worse compared to yesterday?"  If getting worse, ask, "In what way?"     New symptoms every day 8. HIGH RISK DISEASE: "Do you have any chronic medical problems?" (e.g., asthma, heart or lung disease, weak immune system, etc.)     HTN 9. PREGNANCY: "Is there  any chance you are pregnant?" "When was your last menstrual period?"     n/a 10. OTHER SYMPTOMS: "Do you have any other symptoms?"  (e.g., runny nose, headache, sore throat, loss of smell)       Loss of taste, headache to forehead, tickle to throat, stomach pain dull and aching, no ppetite  Protocols used: CORONAVIRUS (COVID-19) DIAGNOSED OR SUSPECTED-A-AH

## 2019-03-02 NOTE — Progress Notes (Signed)
Virtual Visit via Video Note  I connected with [name] on 03/02/19 at  2:00 PM EDT by a video enabled telemedicine application and verified that I am speaking with the correct person using two identifiers.   I discussed the limitations of evaluation and management by telemedicine and the availability of in person appointments. The patient expressed understanding and agreed to proceed.  History of Present Illness: Pt is at work c/o chills, low grade fever , dry cough and sob.  Symptoms are worsening.  She is not sob while on web ex.  She has also had upset stomach    She works at [employer] and they have covid tests for employees but the felt she did not have enough symptoms to test her.  Her husband came home with the same symptoms yesterday.  Hers have been progressively gettling worse since suncay  provider-- in office[facility] Observations/Objective: 98.6  Temp today---she has taken tylenol  rr normal Pt does not look sob / toxic  Neatly dressed Pt NAD    Assessment and Plan: 1. Bronchitis ? 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 Educated About Covid-19 Virus Infection Pt instructed to go to er if breathing worsens    Follow Up Instructions:    I discussed the assessment and treatment plan with the patient. The patient was provided an opportunity to ask questions and all were answered. The patient agreed with the plan and demonstrated an understanding of the instructions.   The patient was advised to call back or seek an in-person evaluation if the symptoms worsen or if the condition fails to improve as anticipated.  I provided 25 minutes of non-face-to-face time during this encounter.--> 50% discussing covid symptoms and when to go to ER Advised pt to quarantine herself and stay home for at least a week or 72  hours after symptoms resolved   Ann Held, DO

## 2019-03-05 ENCOUNTER — Other Ambulatory Visit: Payer: Self-pay | Admitting: Family Medicine

## 2019-03-05 DIAGNOSIS — F411 Generalized anxiety disorder: Secondary | ICD-10-CM

## 2019-03-05 MED ORDER — ALPRAZOLAM 0.5 MG PO TABS: 0.5000 mg | ORAL_TABLET | Freq: Three times a day (TID) | ORAL | 1 refills | Status: DC | PRN

## 2019-03-05 MED ORDER — FLUOXETINE HCL 20 MG PO CAPS: 20.0000 mg | ORAL_CAPSULE | Freq: Every day | ORAL | 2 refills | Status: DC

## 2019-03-05 NOTE — Telephone Encounter (Signed)
sent 

## 2019-03-05 NOTE — Telephone Encounter (Signed)
Last written: 08/01/18 Last ov: 03/02/19 Next ov: none Contract: 08/02/19 UDS: 08/02/19

## 2019-03-22 IMAGING — DX DG KNEE COMPLETE 4+V*L*
4 series · 4 of 4 positions shown · non-contrast
Comparison: None.

CLINICAL DATA: 46-year-old with chronic anterior left knee pain.

EXAM:
LEFT KNEE - COMPLETE 4+ VIEW

[knee ap]
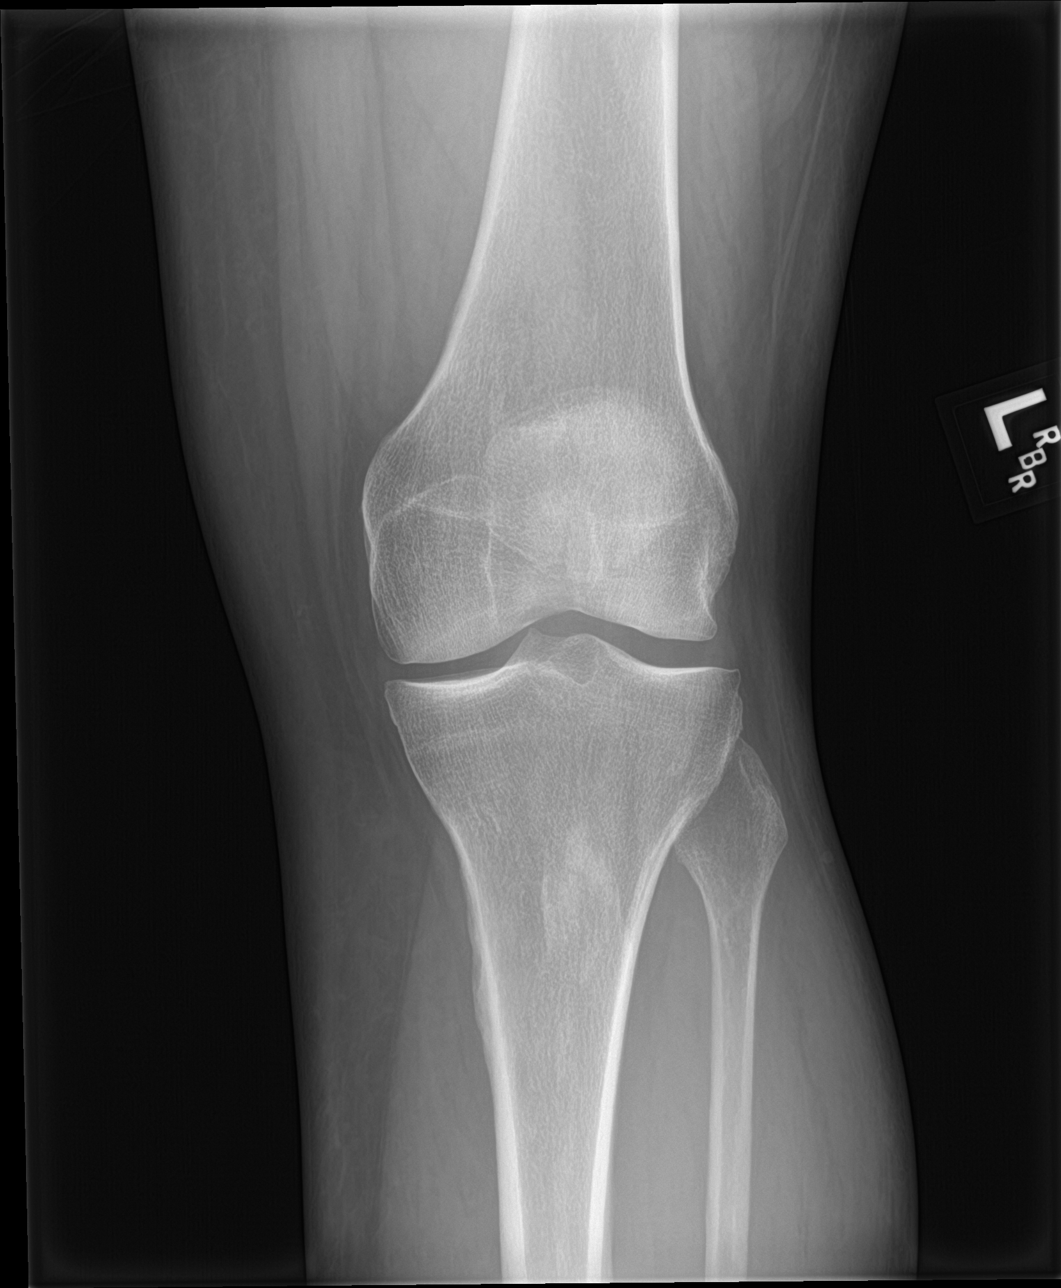

[knee lat]
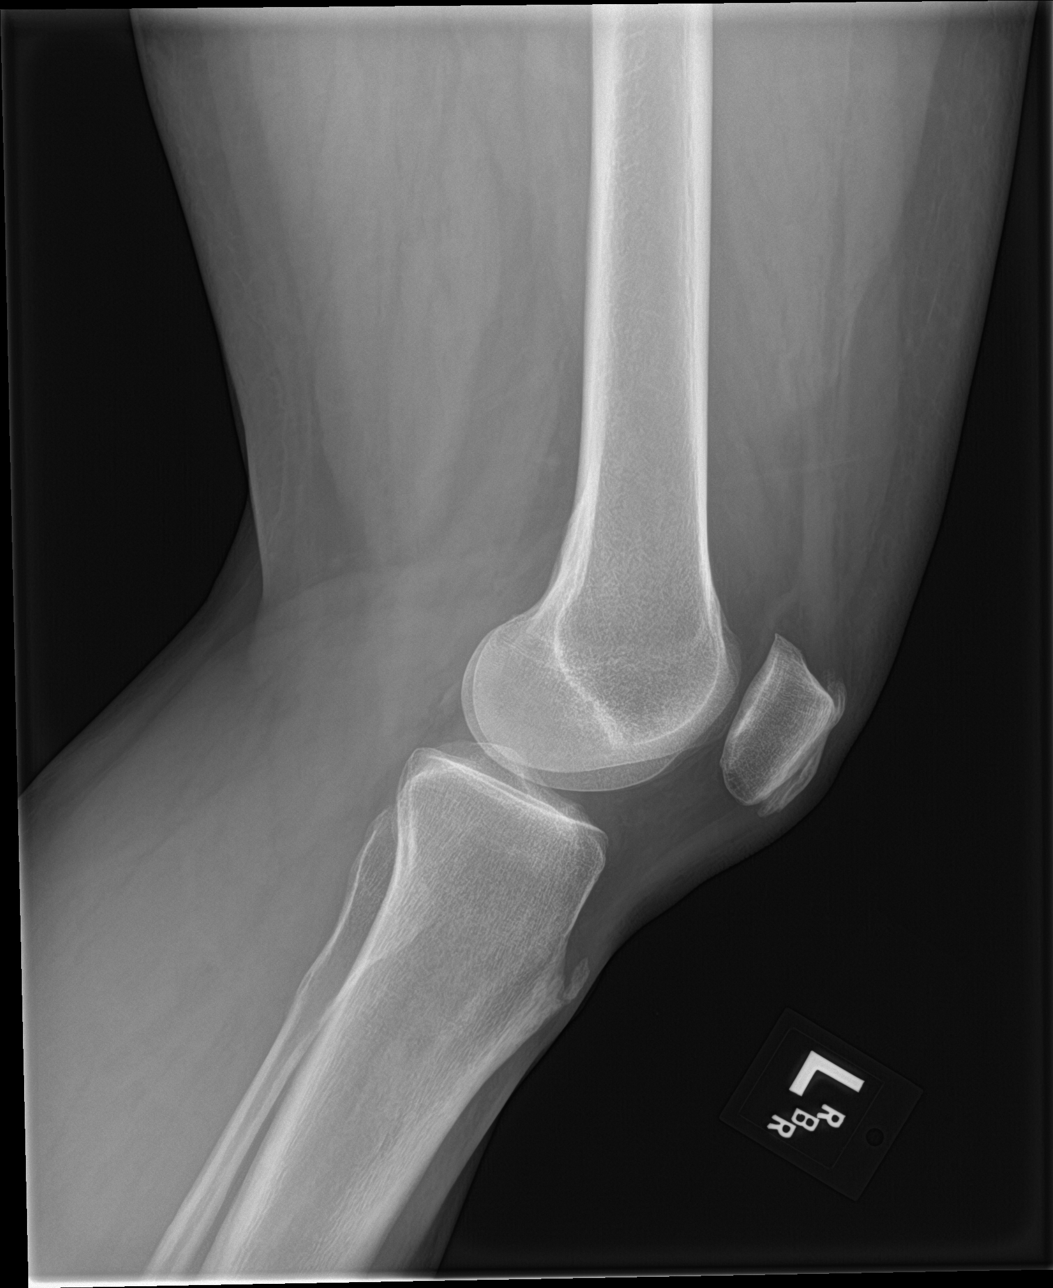

[knee obl (1 of 2)]
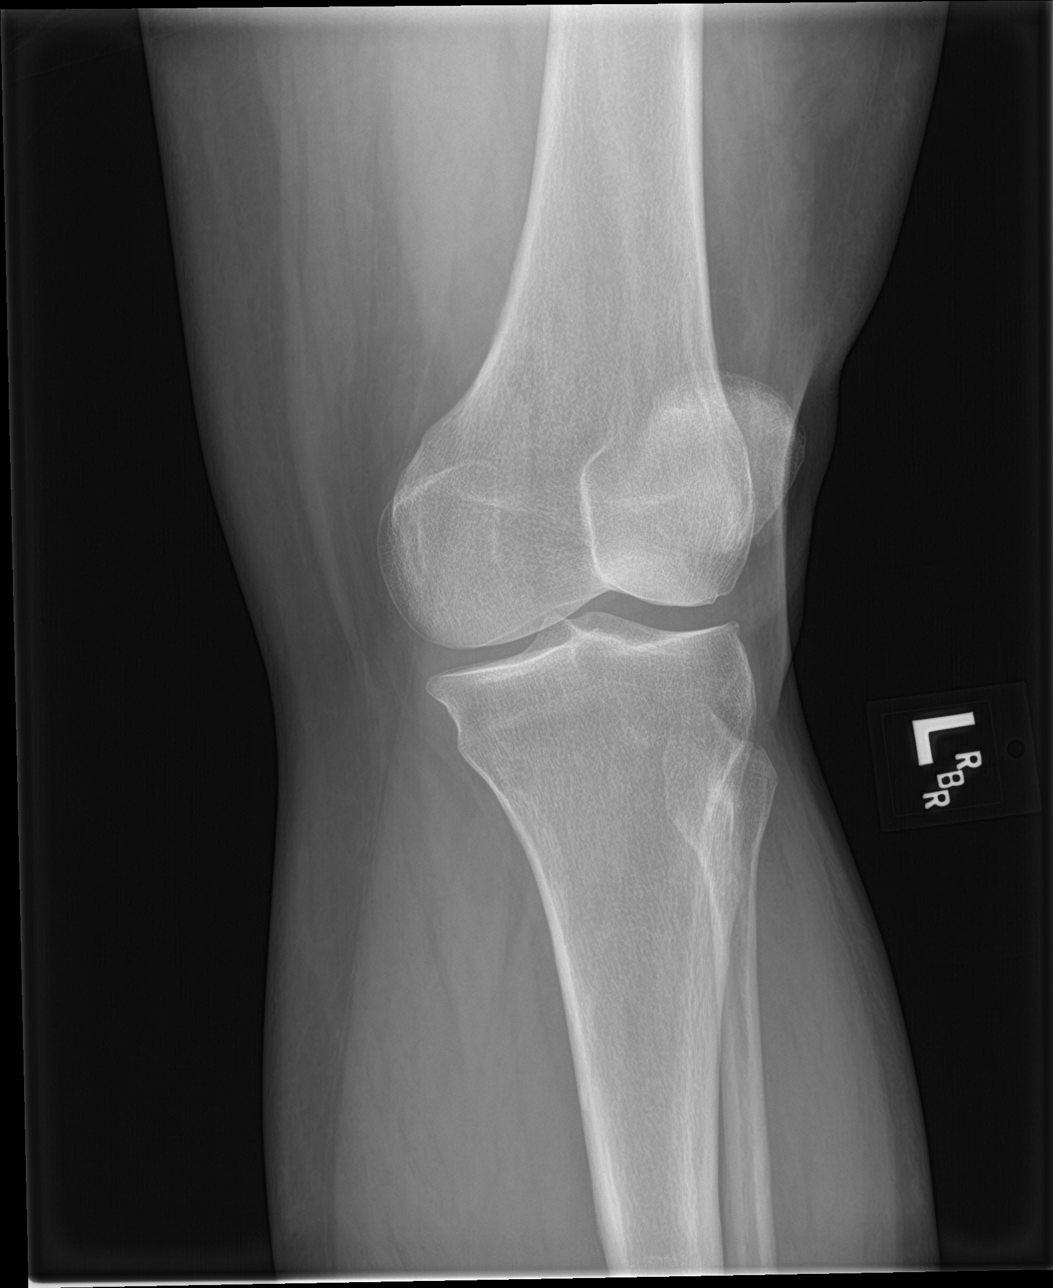

[knee obl (2 of 2)]
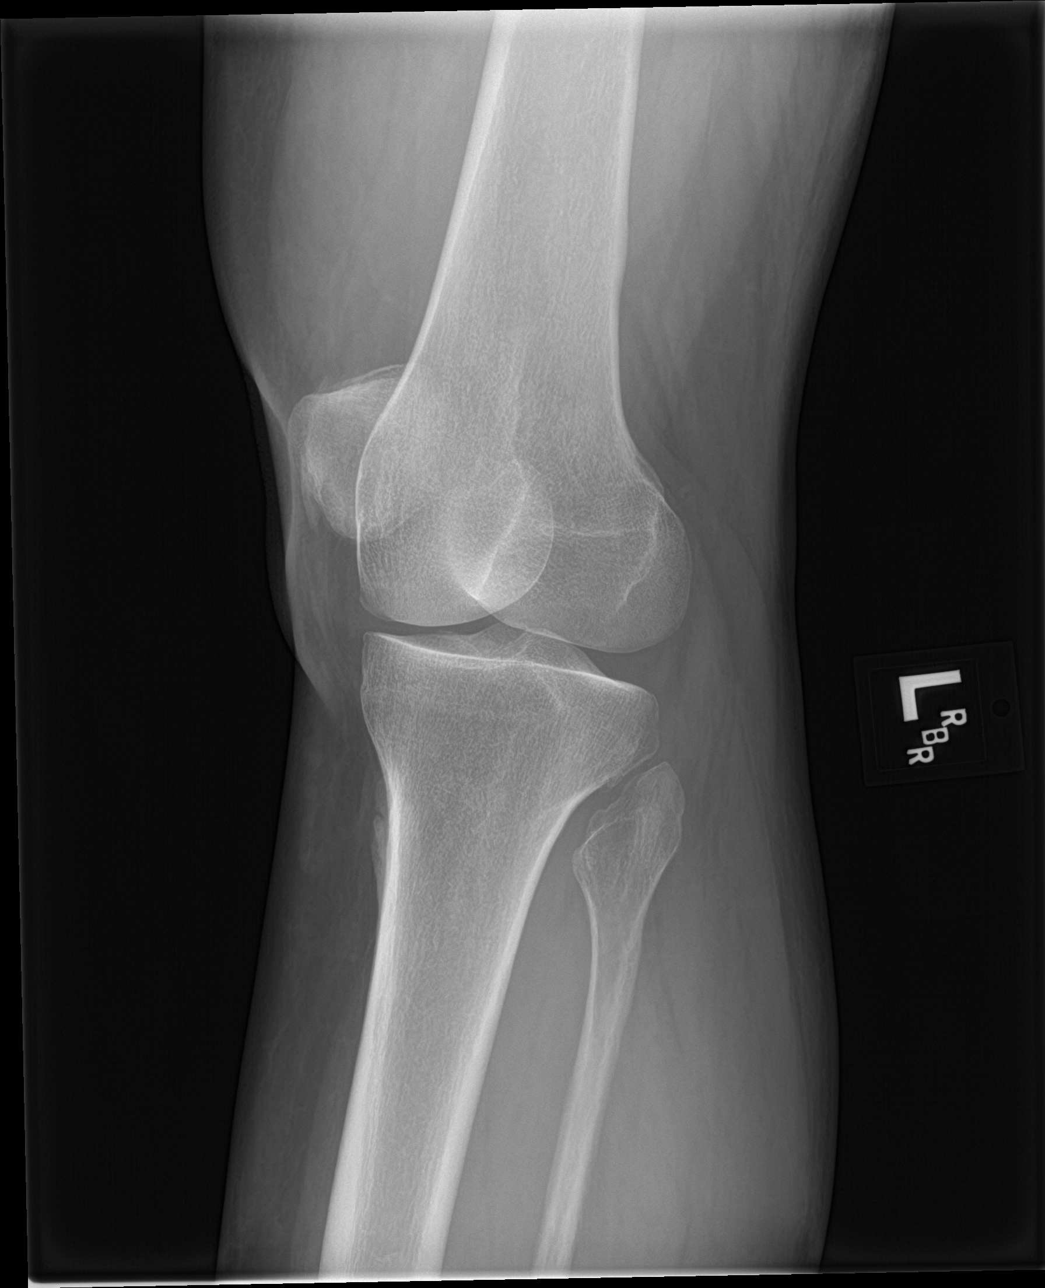

[4 of 4 positions shown; findings below may reference images not displayed]

FINDINGS: Left knee is located without a fracture or large joint effusion.
Enthesopathic changes involving the patella and tibial tuberosity.
Mild medial joint space narrowing. Mild spurring in the
patellofemoral compartment of the knee.
IMPRESSION: Mild osteoarthritis in left knee without acute bone abnormality.

Enthesopathic changes involving the patella and tibial tuberosity.

## 2019-03-22 IMAGING — MG DIGITAL SCREENING BILATERAL MAMMOGRAM WITH TOMO AND CAD
6 of 10 series · 6 of 30 positions shown · non-contrast
Comparison: Previous exam(s).

CLINICAL DATA: Screening.

EXAM:
DIGITAL SCREENING BILATERAL MAMMOGRAM WITH TOMO AND CAD

[R CC synth-2D]
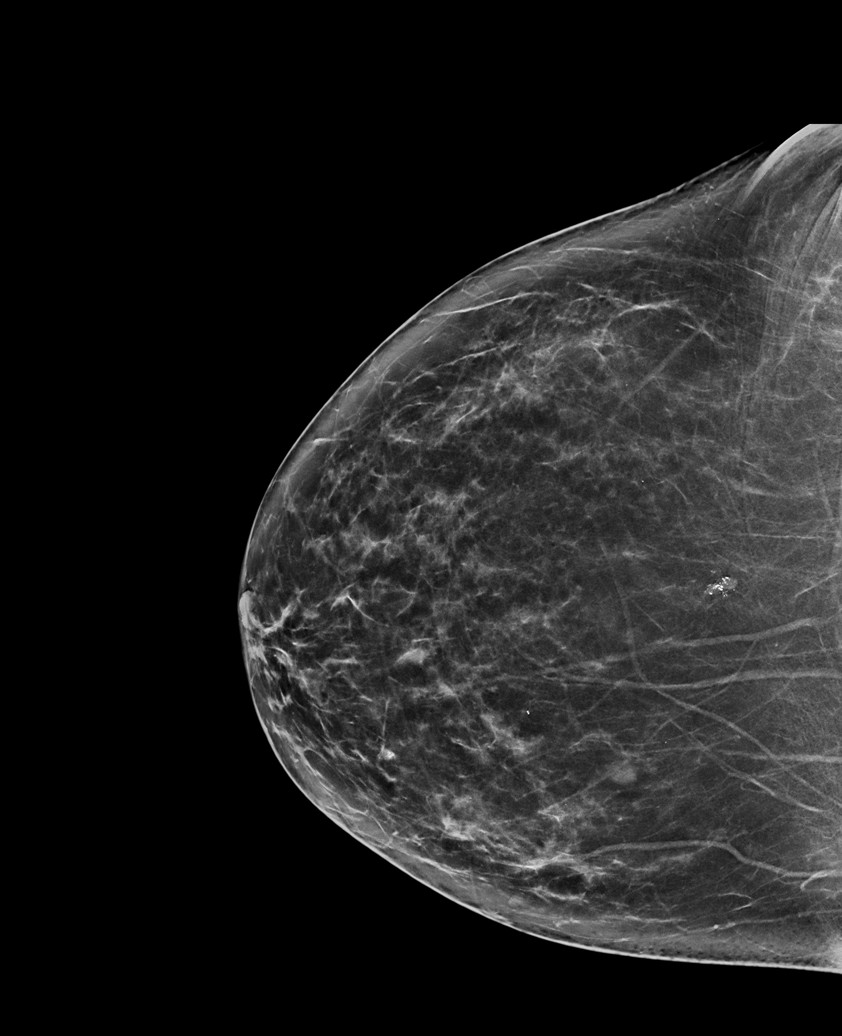

[R MLO synth-2D (1 of 2)]
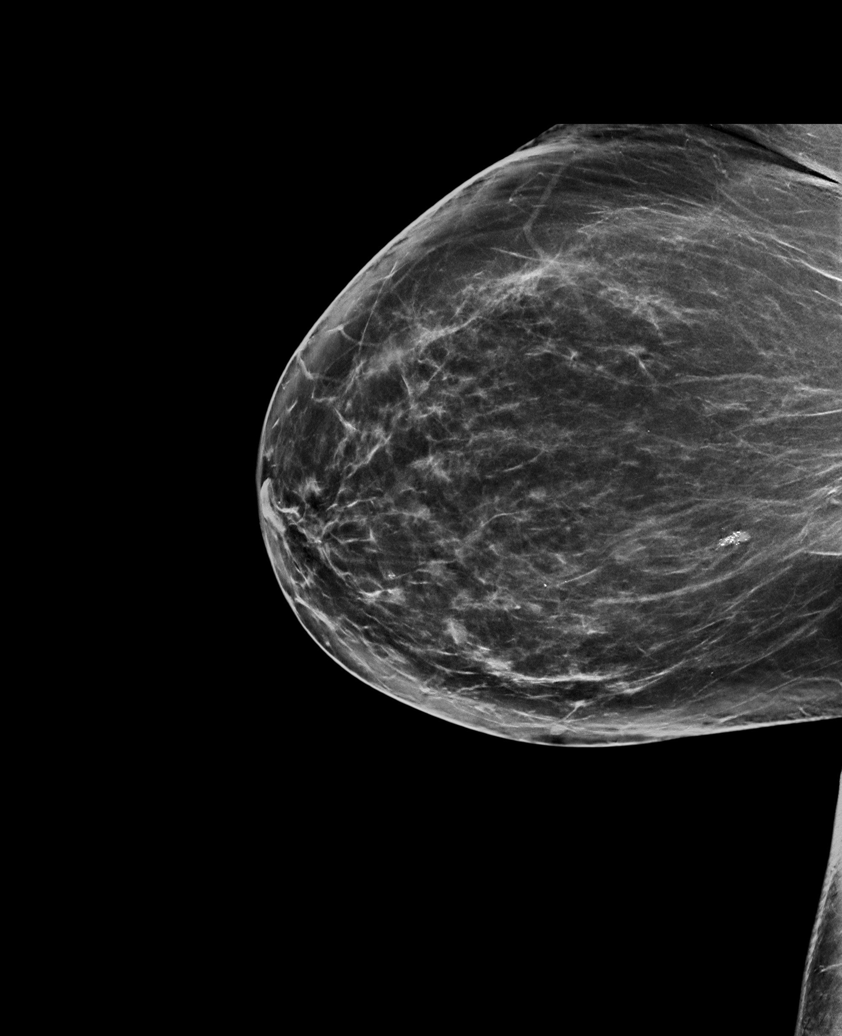

[L MLO synth-2D]
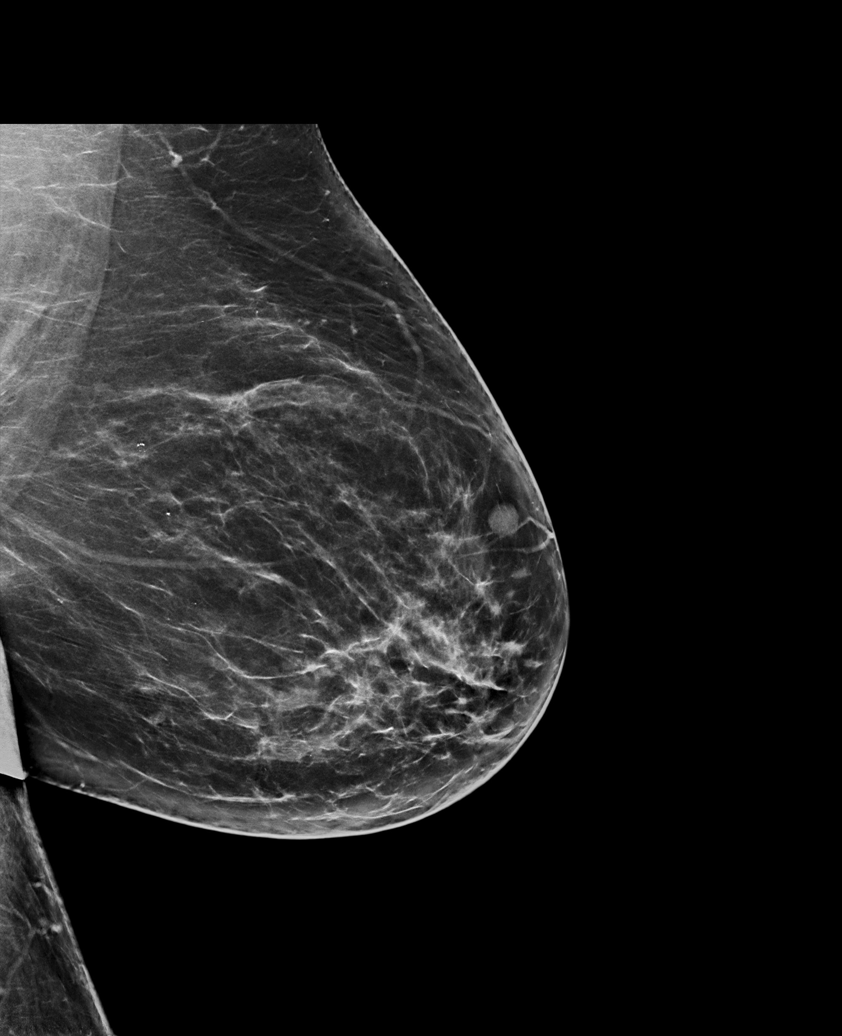

[R MLO synth-2D (2 of 2)]
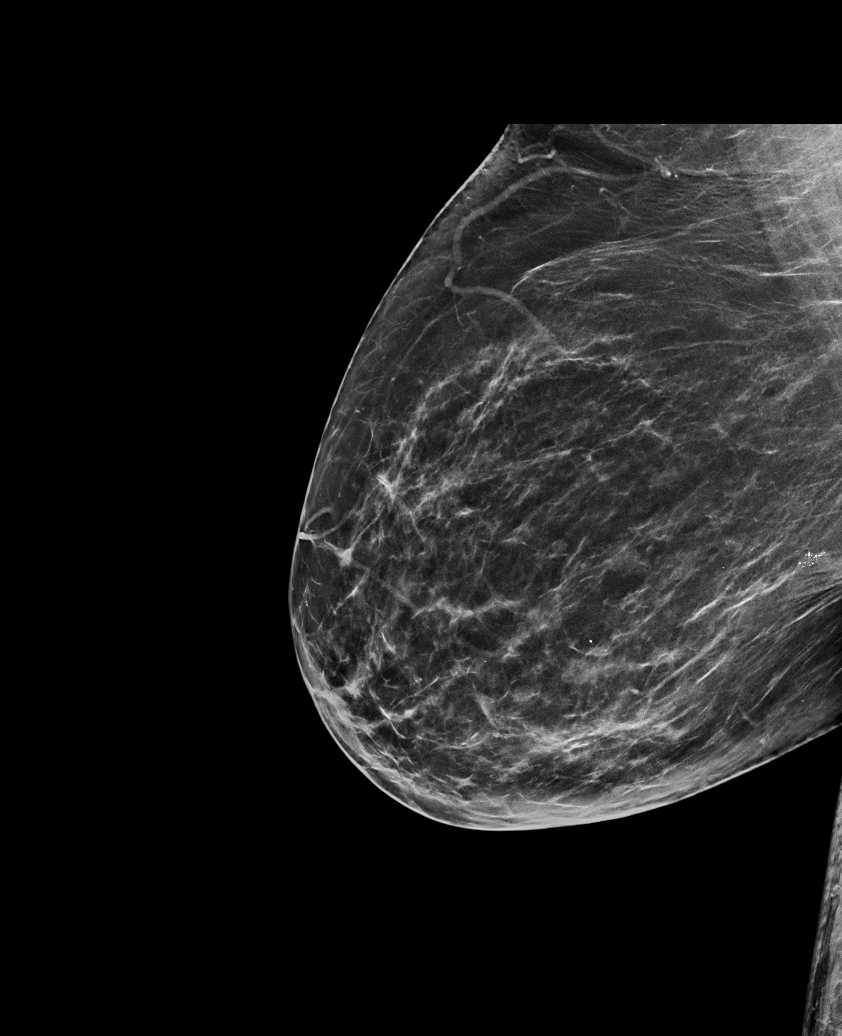

[L CC synth-2D]
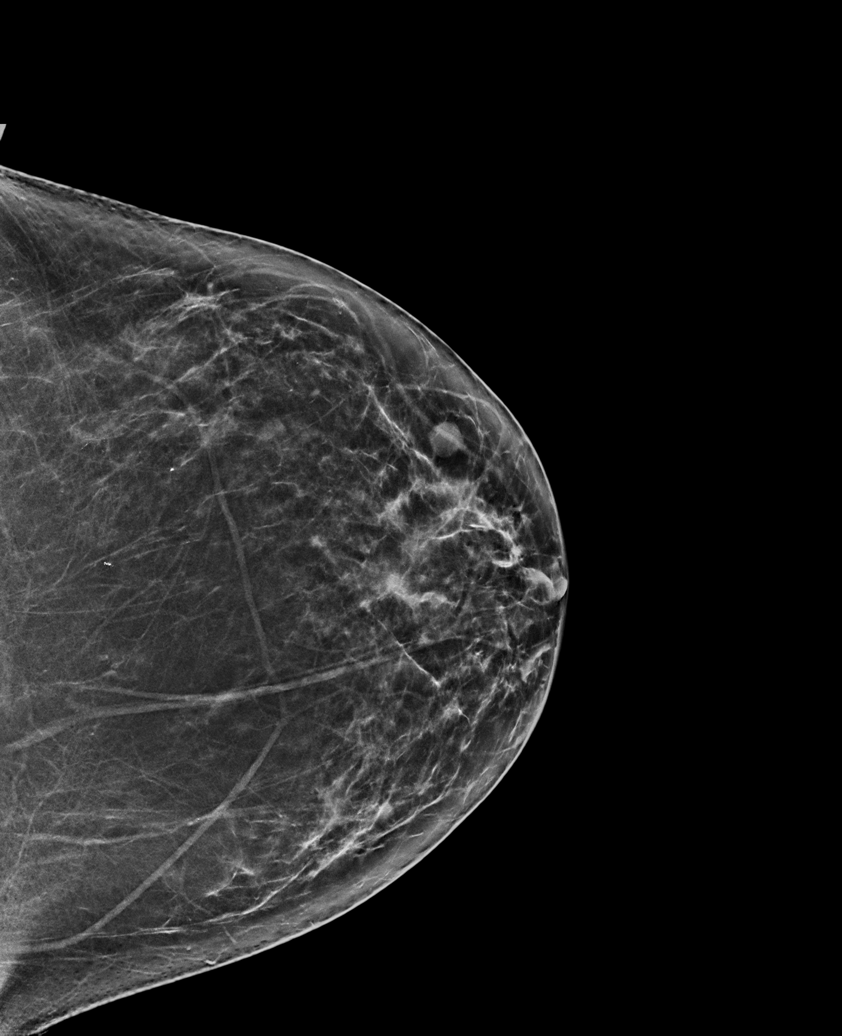

[R MLO tomo · tomo slice 41/82.0]
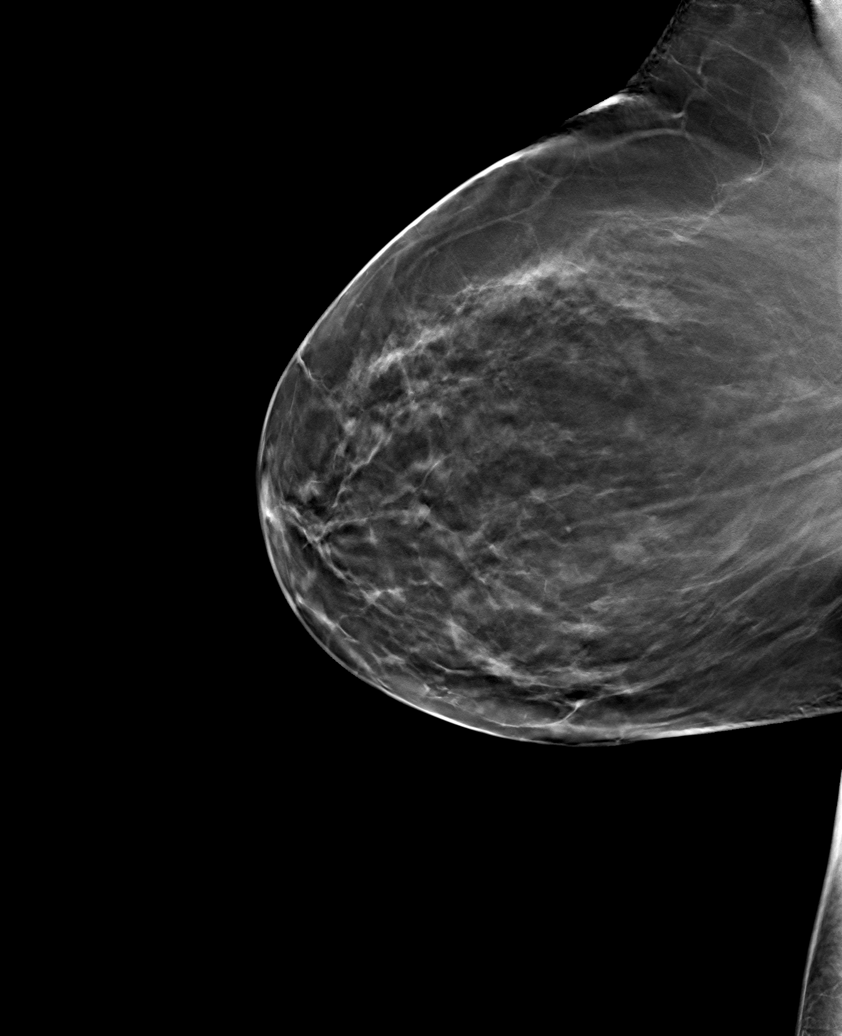

[6 of 30 positions shown; findings below may reference images not displayed]

ACR Breast Density Category b: There are scattered areas of
fibroglandular density.
FINDINGS: In the right breast a group of calcifications and a separate
asymmetry require further evaluation.

In the left breast subtle distortion requires further evaluation.

Images were processed with CAD.
IMPRESSION: Further evaluation is suggested for possible a group of
calcifications and a separate asymmetry in the right breast.

Further evaluation is suggested for possible subtle distortion in
the left breast.

RECOMMENDATION:
Diagnostic mammogram and possibly ultrasound of both breasts.
(Code:[id number])

The patient will be contacted regarding the findings, and additional
imaging will be scheduled.

BI-RADS CATEGORY  0: Incomplete. Need additional imaging evaluation
and/or prior mammograms for comparison.

## 2020-04-22 ENCOUNTER — Ambulatory Visit: Payer: Self-pay | Admitting: Family Medicine

## 2020-04-29 ENCOUNTER — Ambulatory Visit: Payer: Self-pay | Admitting: Family Medicine

## 2020-04-29 DIAGNOSIS — Z0289 Encounter for other administrative examinations: Secondary | ICD-10-CM

## 2022-05-22 ENCOUNTER — Emergency Department (HOSPITAL_BASED_OUTPATIENT_CLINIC_OR_DEPARTMENT_OTHER): Payer: Self-pay

## 2022-05-22 ENCOUNTER — Encounter (HOSPITAL_BASED_OUTPATIENT_CLINIC_OR_DEPARTMENT_OTHER): Payer: Self-pay | Admitting: Emergency Medicine

## 2022-05-22 ENCOUNTER — Emergency Department (HOSPITAL_BASED_OUTPATIENT_CLINIC_OR_DEPARTMENT_OTHER)
Admission: EM | Admit: 2022-05-22 | Discharge: 2022-05-23 | Disposition: A | Discharge status: Home / Self Care | Payer: Self-pay | Attending: Emergency Medicine | Admitting: Emergency Medicine

## 2022-05-22 ENCOUNTER — Other Ambulatory Visit: Payer: Self-pay

## 2022-05-22 DIAGNOSIS — G2581 Restless legs syndrome: Secondary | ICD-10-CM | POA: Insufficient documentation

## 2022-05-22 DIAGNOSIS — R079 Chest pain, unspecified: Secondary | ICD-10-CM

## 2022-05-22 DIAGNOSIS — Z79899 Other long term (current) drug therapy: Secondary | ICD-10-CM | POA: Insufficient documentation

## 2022-05-22 DIAGNOSIS — R519 Headache, unspecified: Secondary | ICD-10-CM

## 2022-05-22 DIAGNOSIS — R000 Tachycardia, unspecified: Secondary | ICD-10-CM | POA: Insufficient documentation

## 2022-05-22 DIAGNOSIS — I10 Essential (primary) hypertension: Secondary | ICD-10-CM | POA: Insufficient documentation

## 2022-05-22 LAB — COMPREHENSIVE METABOLIC PANEL
ALT: 24 U/L (ref 0–44)
AST: 25 U/L (ref 15–41)
Albumin: 3.8 g/dL (ref 3.5–5.0)
Alkaline Phosphatase: 134 U/L — ABNORMAL HIGH (ref 38–126)
Anion gap: 6 (ref 5–15)
BUN: 15 mg/dL (ref 6–20)
CO2: 28 mmol/L (ref 22–32)
Calcium: 9 mg/dL (ref 8.9–10.3)
Chloride: 106 mmol/L (ref 98–111)
Creatinine, Ser: 0.91 mg/dL (ref 0.44–1.00)
GFR, Estimated: >60 mL/min (ref >60)
Glucose, Bld: 115 mg/dL — ABNORMAL HIGH (ref 70–99)
Potassium: 3.4 mmol/L — ABNORMAL LOW (ref 3.5–5.1)
Sodium: 140 mmol/L (ref 135–145)
Total Bilirubin: 0.5 mg/dL (ref 0.3–1.2)
Total Protein: 7.3 g/dL (ref 6.5–8.1)

## 2022-05-22 LAB — CBC WITH DIFFERENTIAL/PLATELET
Abs Immature Granulocytes: 0.05 10*3/uL (ref 0.00–0.07)
Basophils Absolute: 0 10*3/uL (ref 0.0–0.1)
Basophils Relative: 1 %
Eosinophils Absolute: 0.2 10*3/uL (ref 0.0–0.5)
Eosinophils Relative: 2 %
HCT: 38.3 % (ref 36.0–46.0)
Hemoglobin: 13 g/dL (ref 12.0–15.0)
Immature Granulocytes: 1 %
Lymphocytes Relative: 33 %
Lymphs Abs: 2.9 10*3/uL (ref 0.7–4.0)
MCH: 28.4 pg (ref 26.0–34.0)
MCHC: 33.9 g/dL (ref 30.0–36.0)
MCV: 83.6 fL (ref 80.0–100.0)
Monocytes Absolute: 0.5 10*3/uL (ref 0.1–1.0)
Monocytes Relative: 6 %
Neutro Abs: 5 10*3/uL (ref 1.7–7.7)
Neutrophils Relative %: 57 %
Platelets: 307 10*3/uL (ref 150–400)
RBC: 4.58 MIL/uL (ref 3.87–5.11)
RDW: 12.8 % (ref 11.5–15.5)
WBC: 8.7 10*3/uL (ref 4.0–10.5)
nRBC: 0 % (ref 0.0–0.2)

## 2022-05-22 LAB — TROPONIN I (HIGH SENSITIVITY)
Troponin I (High Sensitivity): 5 ng/L (ref <18)
Troponin I (High Sensitivity): 9 ng/L (ref <18)

## 2022-05-22 MED ORDER — LABETALOL HCL 5 MG/ML IV SOLN
10.0000 mg | Freq: Once | INTRAVENOUS | Status: AC
Start: 2022-05-22 — End: 2022-05-22
  Administered 2022-05-22: 10 mg via INTRAVENOUS
  Filled 2022-05-22: qty 4

## 2022-05-22 MED ORDER — DIPHENHYDRAMINE HCL 50 MG/ML IJ SOLN
25.0000 mg | Freq: Once | INTRAMUSCULAR | Status: AC
  Administered 2022-05-22: 25 mg via INTRAVENOUS
  Filled 2022-05-22: qty 1

## 2022-05-22 MED ORDER — LISINOPRIL-HYDROCHLOROTHIAZIDE 20-25 MG PO TABS: 1.0000 | ORAL_TABLET | Freq: Every day | ORAL | 0 refills | Status: DC

## 2022-05-22 MED ORDER — IOHEXOL 350 MG/ML SOLN
100.0000 mL | Freq: Once | INTRAVENOUS | Status: AC | PRN
  Administered 2022-05-22: 100 mL via INTRAVENOUS

## 2022-05-22 MED ORDER — HYDRALAZINE HCL 20 MG/ML IJ SOLN
10.0000 mg | Freq: Once | INTRAMUSCULAR | Status: AC
  Administered 2022-05-22: 10 mg via INTRAVENOUS
  Filled 2022-05-22: qty 1

## 2022-05-22 MED ORDER — LORAZEPAM 2 MG/ML IJ SOLN
1.0000 mg | Freq: Once | INTRAMUSCULAR | Status: AC
  Administered 2022-05-22: 1 mg via INTRAVENOUS
  Filled 2022-05-22: qty 1

## 2022-05-22 MED ORDER — METOCLOPRAMIDE HCL 5 MG/ML IJ SOLN
10.0000 mg | Freq: Once | INTRAMUSCULAR | Status: AC
  Administered 2022-05-22: 10 mg via INTRAVENOUS
  Filled 2022-05-22: qty 2

## 2022-05-22 MED ORDER — DIAZEPAM 5 MG/ML IJ SOLN
5.0000 mg | Freq: Once | INTRAMUSCULAR | Status: AC
Start: 2022-05-22 — End: 2022-05-22
  Administered 2022-05-22: 5 mg via INTRAVENOUS
  Filled 2022-05-22: qty 2

## 2022-05-22 NOTE — ED Notes (Signed)
Patient returned from CT at this time. Per transporter, patient was unable to lay still, they had to repeat the head CT twice and were unable to complete the CT angio. Requesting sedation prior to being transported back to CT. Dr. Silverio Lay made aware.

## 2022-05-22 NOTE — ED Provider Notes (Signed)
2:57 AM Assumed care from Dr. Silverio Lay, please see their note for full history, physical and decision making until this point. In brief this is a 51 y.o. year old female who presented to the ED tonight with Hypertension and Numbness     Hypertension with multiple symptoms. Head ct ok. Pending repeat troponin, cta and reeval as she is tachy related to headache cocktail.   Persistently improving HR. Asymptoamtic. Ct ok. Troponins reassuring. Rx for meds provided. PCP follow up advised. Stable for d/c.   Discharge instructions, including strict return precautions for new or worsening symptoms, given. Patient and/or family verbalized understanding and agreement with the plan as described.   Labs, studies and imaging reviewed by myself and considered in medical decision making if ordered. Imaging interpreted by radiology.  Labs Reviewed  COMPREHENSIVE METABOLIC PANEL - Abnormal; Notable for the following components:      Result Value   Potassium 3.4 (*)    Glucose, Bld 115 (*)    Alkaline Phosphatase 134 (*)    All other components within normal limits  CBC WITH DIFFERENTIAL/PLATELET  TROPONIN I (HIGH SENSITIVITY)  TROPONIN I (HIGH SENSITIVITY)    CT Angio Chest/Abd/Pel for Dissection W and/or Wo Contrast  Final Result    CT Head Wo Contrast  Final Result    DG Chest Port 1 View  Final Result      No follow-ups on file.    [name], Barbara Cower, MD 05/23/22 [phone]

## 2022-05-22 NOTE — ED Notes (Signed)
Radiology tech called this nurse to pt's room; pt c/o sudden onset burning pain from neck radiating all the way down to feet; HR noted to elevated (139) ; EDP made aware

## 2022-05-22 NOTE — ED Notes (Signed)
Patient transported to CT via stretcher at this time.  

## 2022-05-22 NOTE — ED Provider Notes (Signed)
[facility] EMERGENCY DEPARTMENT Provider Note   CSN: [id number] Arrival date & time: 05/22/22  2034     History  Chief Complaint  Patient presents with   Hypertension   Numbness    [name] is a 51 y.o. female history of restless leg syndrome, hypertension, here presenting with headache and chest pain.  Patient states that she has been having headache for the last 3 days.  Patient also has some right arm numbness and also chest pain and back pain.  Patient states that she switch PCP and her blood pressure medicine was switched from lisinopril and hydrochlorothiazide to an ARB.  Patient states that she ran out of her blood pressure medicine about a week ago.   The history is provided by the patient.       Home Medications Prior to Admission medications   Medication Sig Start Date End Date Taking? Authorizing Provider  ALPRAZolam Prudy Feeler) 0.5 MG tablet Take 1 tablet (0.5 mg total) by mouth 3 (three) times daily as needed. 03/05/19   Donato Schultz, DO  azithromycin (ZITHROMAX Z-PAK) 250 MG tablet As directed 03/02/19   Zola Button, Grayling Congress, DO  FLUoxetine (PROZAC) 20 MG capsule Take 1 capsule (20 mg total) by mouth daily. 03/05/19   Seabron Spates R, DO  fluticasone (FLONASE) 50 MCG/ACT nasal spray Place 2 sprays into both nostrils daily. 03/13/18   Saguier, Ramon Dredge, PA-C  lisinopril-hydrochlorothiazide (PRINZIDE,ZESTORETIC) 20-25 MG tablet Take 1 tablet by mouth once daily 02/16/19   Zola Button, Myrene Buddy R, DO  nystatin (MYCOSTATIN/NYSTOP) powder Apply topically 4 (four) times daily. 08/01/18   Donato Schultz, DO      Allergies    Metoclopramide    Review of Systems   Review of Systems  Cardiovascular:  Positive for chest pain.  Neurological:  Positive for headaches.  All other systems reviewed and are negative.   Physical Exam Updated Vital Signs BP (!) 149/94   Pulse (!) 102   Temp 98 F (36.7 C) (Oral)   Resp 14   Ht 5' (1.524 m)   Wt  90.2 kg   SpO2 98%   BMI 38.84 kg/m  Physical Exam Vitals and nursing note reviewed.  Constitutional:      Comments: Uncomfortable and photophobic  HENT:     Head: Normocephalic.     Nose: Nose normal.     Mouth/Throat:     Mouth: Mucous membranes are moist.  Eyes:     Extraocular Movements: Extraocular movements intact.     Pupils: Pupils are equal, round, and reactive to light.  Cardiovascular:     Rate and Rhythm: Regular rhythm. Tachycardia present.     Pulses: Normal pulses.     Heart sounds: Normal heart sounds.  Pulmonary:     Effort: Pulmonary effort is normal.     Breath sounds: Normal breath sounds.  Abdominal:     General: Abdomen is flat.     Palpations: Abdomen is soft.  Musculoskeletal:        General: Normal range of motion.     Cervical back: Normal range of motion and neck supple.     Comments: Patient is moving her legs constantly from restless leg syndrome  Skin:    General: Skin is warm.     Capillary Refill: Capillary refill takes less than 2 seconds.  Neurological:     General: No focal deficit present.     Mental Status: She is oriented to person,  place, and time.     Comments: Cranial nerves II to XII intact.  Patient has normal strength and sensation bilateral arms and legs  Psychiatric:        Mood and Affect: Mood normal.        Behavior: Behavior normal.     ED Results / Procedures / Treatments   Labs (all labs ordered are listed, but only abnormal results are displayed) Labs Reviewed  COMPREHENSIVE METABOLIC PANEL - Abnormal; Notable for the following components:      Result Value   Potassium 3.4 (*)    Glucose, Bld 115 (*)    Alkaline Phosphatase 134 (*)    All other components within normal limits  CBC WITH DIFFERENTIAL/PLATELET  TROPONIN I (HIGH SENSITIVITY)  TROPONIN I (HIGH SENSITIVITY)    EKG EKG Interpretation  Date/Time:  Saturday May 22 2022 21:45:38 EDT Ventricular Rate:  135 PR Interval:  131 QRS Duration: 79 QT  Interval:  343 QTC Calculation: 515 R Axis:   68 Text Interpretation: Sinus tachycardia Probable anteroseptal infarct, old Repol abnrm suggests ischemia, diffuse leads Prolonged QT interval rate faster than earlier in the day Confirmed by Richardean Canal [id number]) on 05/22/2022 10:05:06 PM  Radiology CT Head Wo Contrast  Result Date: 05/22/2022 CLINICAL DATA:  Headache, new or worsening (Age >= 50y) EXAM: CT HEAD WITHOUT CONTRAST TECHNIQUE: Contiguous axial images were obtained from the base of the skull through the vertex without intravenous contrast. RADIATION DOSE REDUCTION: This exam was performed according to the departmental dose-optimization program which includes automated exposure control, adjustment of the mA and/or kV according to patient size and/or use of iterative reconstruction technique. COMPARISON:  None Available. FINDINGS: Brain: Normal anatomic configuration. No abnormal intra or extra-axial mass lesion or fluid collection. No abnormal mass effect or midline shift. No evidence of acute intracranial hemorrhage or infarct. Ventricular size is normal. Cerebellum unremarkable. Vascular: Unremarkable Skull: Intact Sinuses/Orbits: Paranasal sinuses are clear. Orbits are unremarkable. Other: Mastoid air cells and middle ear cavities are clear. IMPRESSION: No acute intracranial abnormality. Electronically Signed   By: Helyn Numbers M.D.   On: 05/22/2022 22:57   DG Chest Port 1 View  Result Date: 05/22/2022 CLINICAL DATA:  Chest pain and right side numbness for 2 days. EXAM: PORTABLE CHEST 1 VIEW COMPARISON:  10/27/2016 FINDINGS: Low lung volumes. Heart size and mediastinal contours appear normal. No signs of pleural effusion or edema. No airspace opacities identified. The visualized osseous structures are unremarkable. IMPRESSION: 1. Low lung volumes. 2. No acute findings. Electronically Signed   By: Signa Kell M.D.   On: 05/22/2022 21:58    Procedures Procedures    Medications Ordered  in ED Medications  iohexol (OMNIPAQUE) 350 MG/ML injection 100 mL (has no administration in time range)  diazepam (VALIUM) injection 5 mg (has no administration in time range)  hydrALAZINE (APRESOLINE) injection 10 mg (10 mg Intravenous Given 05/22/22 2133)  metoCLOPramide (REGLAN) injection 10 mg (10 mg Intravenous Given 05/22/22 2133)  diphenhydrAMINE (BENADRYL) injection 25 mg (25 mg Intravenous Given 05/22/22 2133)  labetalol (NORMODYNE) injection 10 mg (10 mg Intravenous Given 05/22/22 2153)  LORazepam (ATIVAN) injection 1 mg (1 mg Intravenous Given 05/22/22 2202)  diphenhydrAMINE (BENADRYL) injection 25 mg (25 mg Intravenous Given 05/22/22 2205)  LORazepam (ATIVAN) injection 1 mg (1 mg Intravenous Given 05/22/22 2252)    ED Course/ Medical Decision Making/ A&P  Medical Decision Making BREYON BAPST is a 51 y.o. female here presenting with chest pain and also headache and also hypertension.  Patient ran out of her BP meds about a week ago.  Patient is scheduled to see PCP in another week.  Consider subarachnoid hemorrhage given headache.  Also consider dissection as well.  We will get CT dissection study and CT Noncon of the head and also CBC and CMP and troponin.  11:09 PM Patient received migraine cocktail and her restless leg syndrome got worse.  She is unable to stay still in the CT scan.  We will order some Valium and get CTA again  11:27 PM Patient more calm after valium. Dissection study and second trop pending. Signed out to Dr. Erin Hearing to follow up these tests and reassess patient.    Amount and/or Complexity of Data Reviewed Labs: ordered. Radiology: ordered. ECG/medicine tests: ordered.  Risk Prescription drug management.    Final Clinical Impression(s) / ED Diagnoses Final diagnoses:  None    Rx / DC Orders ED Discharge Orders     None         Charlynne Pander, MD 05/22/22 2334

## 2022-05-22 NOTE — ED Notes (Signed)
Dr. Yao at bedside. 

## 2022-05-22 NOTE — ED Triage Notes (Signed)
Reports right sided numbness for the last two days.  Reports blood pressure elevated today.

## 2022-05-22 NOTE — Discharge Instructions (Addendum)
Please take your blood pressure medicine as prescribed and see your doctor next week as scheduled  Take Tylenol for headache  Return to ER if you have worse headache or chest pain or shortness of

## 2022-05-23 MED ORDER — LISINOPRIL-HYDROCHLOROTHIAZIDE 20-25 MG PO TABS: 1.0000 | ORAL_TABLET | Freq: Every day | ORAL | 1 refills | Status: DC

## 2022-05-31 ENCOUNTER — Ambulatory Visit (INDEPENDENT_AMBULATORY_CARE_PROVIDER_SITE_OTHER): Payer: Self-pay | Admitting: Family Medicine

## 2022-05-31 ENCOUNTER — Encounter: Payer: Self-pay | Admitting: Family Medicine

## 2022-05-31 VITALS — BP 200/90 | HR 80 | Temp 98.0°F | Resp 16 | Ht 60.0 in | Wt 201.2 lb

## 2022-05-31 DIAGNOSIS — F419 Anxiety disorder, unspecified: Secondary | ICD-10-CM

## 2022-05-31 DIAGNOSIS — I10 Essential (primary) hypertension: Secondary | ICD-10-CM

## 2022-05-31 DIAGNOSIS — R739 Hyperglycemia, unspecified: Secondary | ICD-10-CM

## 2022-05-31 DIAGNOSIS — Z0000 Encounter for general adult medical examination without abnormal findings: Secondary | ICD-10-CM

## 2022-05-31 MED ORDER — FLUOXETINE HCL 40 MG PO CAPS: 40.0000 mg | ORAL_CAPSULE | Freq: Every day | ORAL | 3 refills | Status: DC

## 2022-05-31 MED ORDER — LOSARTAN POTASSIUM-HCTZ 100-25 MG PO TABS: 1.0000 | ORAL_TABLET | Freq: Every day | ORAL | 1 refills | Status: DC

## 2022-05-31 NOTE — Assessment & Plan Note (Signed)
Poorly controlled will alter medications, encouraged DASH diet, minimize caffeine and obtain adequate sleep. Report concerning symptoms and follow up as directed and as needed 

## 2022-05-31 NOTE — Patient Instructions (Signed)

## 2022-05-31 NOTE — Assessment & Plan Note (Signed)
ghm utd Check labs  See avs  

## 2022-05-31 NOTE — Progress Notes (Signed)
Subjective:   By signing my name below, I, [name], attest that this documentation has been prepared under the direction and in the presence of Ann Held DO 05/31/2022    Patient ID: [name], female    DOB: [birth year], 51 y.o.   MRN: [id number]  Chief Complaint  Patient presents with   Annual Exam    Here for annual exam    HPI Patient is in today for a comprehensive physical exam   As of today's visit, her blood pressure is elevating. She was previously at [facility] and said that her provider prescribed her Telmisartan which she states helped her blood pressure but then her father then passed away. She also states that while she was on the medication, she was experiencing headaches.  She went to the ED on 05/22/2022 for chest tightness and headache and was diagnosed with hypertension. During her stay she was prescribed 20-25 Mg of Lisinopril which she finished on 05/31/2022.  She currently is without insurance. She does have an Rx card for discounts. She states that she is refining her diet to tailor to her hypertension. She reports that she does have a blood pressure cuff at home.  BP Readings from Last 3 Encounters:  05/31/22 (!) 200/90  05/23/22 (!) 147/89  09/05/18 102/66   Pulse Readings from Last 3 Encounters:  05/31/22 80  05/23/22 (!) 103  09/05/18 68   She states that she is currently taking 20 Mg of Prozac. She also states that she is currently experiencing external stressors. She is taking care of her siblings due to her father's passing. She is not in counseling currently. She is interested in increasing her dosage of Prozac.   She reports that she is experiencing high blood sugar levels. She has a monitor at home and reports sugar levels are 300+ mg/dL. She states that levels fluctuate.  Lab Results  Component Value Date   HGBA1C 5.7 10/27/2016    She has a partial hysterectomy with her ovaries intact. She reports that she has  occasional hot flashes  She denies having any fever, new muscle pain, joint pain , new moles, congestion, sinus pain, sore throat, chest pain, palpations, cough, SOB ,wheezing,n/v/d constipation, blood in stool, dysuria, frequency, hematuria, at this time  She reports that her father passed away last year.  Mammogram last on 08/01/2018  Past Medical History:  Diagnosis Date   Anxiety    H/O endometritis    Hypertension     Past Surgical History:  Procedure Laterality Date   ABDOMINAL HYSTERECTOMY  05/05/2011   TAH---fibroids and endometriosis   CHOLECYSTECTOMY  02/2010    Family History  Problem Relation Age of Onset   Diabetes Mother    Kidney disease Mother    Hypertension Father    Heart disease Father    Rheum arthritis Father    Cirrhosis Father    Heart disease Paternal Grandmother    Breast cancer Paternal Grandmother    Osteoarthritis Paternal Grandfather    Cancer Paternal Uncle        liver with mets     Social History   Socioeconomic History   Marital status: Married    Spouse name: Not on file   Number of children: Not on file   Years of education: Not on file   Highest education level: Not on file  Occupational History   Occupation: Pharmacist, hospital    Employer: [employer]    Comment: [employer]  Tobacco Use   Smoking status: Former    Types: Cigarettes    Quit date: 11/29/1988    Years since quitting: 33.5   Smokeless tobacco: Never  Substance and Sexual Activity   Alcohol use: Yes    Comment: rare wine use   Drug use: No   Sexual activity: Yes    Partners: Male  Other Topics Concern   Not on file  Social History Narrative   Not on file   Social Determinants of Health   Financial Resource Strain: Not on file  Food Insecurity: Not on file  Transportation Needs: Not on file  Physical Activity: Not on file  Stress: Not on file  Social Connections: Not on file  Intimate Partner Violence: Not on file    Outpatient Medications Prior  to Visit  Medication Sig Dispense Refill   ALPRAZolam (XANAX) 0.5 MG tablet Take 1 tablet (0.5 mg total) by mouth 3 (three) times daily as needed. 60 tablet 1   azithromycin (ZITHROMAX Z-PAK) 250 MG tablet As directed 6 each 0   FLUoxetine (PROZAC) 20 MG capsule Take 1 capsule (20 mg total) by mouth daily. 30 capsule 2   fluticasone (FLONASE) 50 MCG/ACT nasal spray Place 2 sprays into both nostrils daily. 16 g 1   lisinopril-hydrochlorothiazide (ZESTORETIC) 20-25 MG tablet Take 1 tablet by mouth daily. 30 tablet 1   nystatin (MYCOSTATIN/NYSTOP) powder Apply topically 4 (four) times daily. 15 g 0   No facility-administered medications prior to visit.    Allergies  Allergen Reactions   Metoclopramide Anxiety    Review of Systems  Constitutional:  Negative for fever.  HENT:  Negative for congestion, sinus pain and sore throat.   Respiratory:  Negative for cough, shortness of breath and wheezing.   Cardiovascular:  Negative for chest pain and palpitations.  Gastrointestinal:  Negative for blood in stool, constipation, diarrhea, nausea and vomiting.  Genitourinary:  Negative for dysuria, frequency and hematuria.  Musculoskeletal:  Negative for joint pain and myalgias.  Skin:        (-) New Moles       Objective:    Physical Exam Constitutional:      General: She is not in acute distress.    Appearance: Normal appearance. She is not ill-appearing.  HENT:     Head: Normocephalic and atraumatic.     Right Ear: Tympanic membrane, ear canal and external ear normal.     Left Ear: Tympanic membrane, ear canal and external ear normal.  Eyes:     Extraocular Movements: Extraocular movements intact.     Pupils: Pupils are equal, round, and reactive to light.  Cardiovascular:     Rate and Rhythm: Normal rate and regular rhythm.     Heart sounds: Normal heart sounds. No murmur heard.    No gallop.  Pulmonary:     Effort: Pulmonary effort is normal. No respiratory distress.     Breath  sounds: Normal breath sounds. No wheezing or rales.  Abdominal:     General: Bowel sounds are normal. There is no distension.     Palpations: Abdomen is soft.     Tenderness: There is no abdominal tenderness. There is no guarding.  Skin:    General: Skin is warm and dry.  Neurological:     Mental Status: She is alert and oriented to person, place, and time.  Psychiatric:        Judgment: Judgment normal.     BP (!) 200/90 (BP Location: Right Arm,  Patient Position: Sitting, Cuff Size: Normal)   Pulse 80   Temp 98 F (36.7 C) (Oral)   Resp 16   Ht 5' (1.524 m)   Wt 201 lb 3.2 oz (91.3 kg)   SpO2 96%   BMI 39.29 kg/m  Wt Readings from Last 3 Encounters:  05/31/22 201 lb 3.2 oz (91.3 kg)  05/22/22 198 lb 13.7 oz (90.2 kg)  09/05/18 194 lb (88 kg)    Diabetic Foot Exam - Simple   No data filed    Lab Results  Component Value Date   WBC 8.7 05/22/2022   HGB 13.0 05/22/2022   HCT 38.3 05/22/2022   PLT 307 05/22/2022   GLUCOSE 115 (H) 05/22/2022   CHOL 146 08/01/2018   TRIG 59.0 08/01/2018   HDL 57.70 08/01/2018   LDLCALC 76 08/01/2018   ALT 24 05/22/2022   AST 25 05/22/2022   NA 140 05/22/2022   K 3.4 (L) 05/22/2022   CL 106 05/22/2022   CREATININE 0.91 05/22/2022   BUN 15 05/22/2022   CO2 28 05/22/2022   TSH 1.51 08/01/2018   HGBA1C 5.7 10/27/2016   MICROALBUR 2.8 (H) 08/31/2012    Lab Results  Component Value Date   TSH 1.51 08/01/2018   Lab Results  Component Value Date   WBC 8.7 05/22/2022   HGB 13.0 05/22/2022   HCT 38.3 05/22/2022   MCV 83.6 05/22/2022   PLT 307 05/22/2022   Lab Results  Component Value Date   NA 140 05/22/2022   K 3.4 (L) 05/22/2022   CO2 28 05/22/2022   GLUCOSE 115 (H) 05/22/2022   BUN 15 05/22/2022   CREATININE 0.91 05/22/2022   BILITOT 0.5 05/22/2022   ALKPHOS 134 (H) 05/22/2022   AST 25 05/22/2022   ALT 24 05/22/2022   PROT 7.3 05/22/2022   ALBUMIN 3.8 05/22/2022   CALCIUM 9.0 05/22/2022   ANIONGAP 6 05/22/2022    GFR 76.93 08/01/2018   Lab Results  Component Value Date   CHOL 146 08/01/2018   Lab Results  Component Value Date   HDL 57.70 08/01/2018   Lab Results  Component Value Date   LDLCALC 76 08/01/2018   Lab Results  Component Value Date   TRIG 59.0 08/01/2018   Lab Results  Component Value Date   CHOLHDL 3 08/01/2018   Lab Results  Component Value Date   HGBA1C 5.7 10/27/2016       Assessment & Plan:   Problem List Items Addressed This Visit       Unprioritized   Preventative health care - Primary    ghm utd  Check labs  See avs       Relevant Orders   Lipid panel   TSH   HTN (hypertension)    Poorly controlled will alter medications, encouraged DASH diet, minimize caffeine and obtain adequate sleep. Report concerning symptoms and follow up as directed and as needed      Relevant Medications   losartan-hydrochlorothiazide (HYZAAR) 100-25 MG tablet   Other Relevant Orders   Lipid panel   TSH   Other Visit Diagnoses     Anxiety       Relevant Medications   FLUoxetine (PROZAC) 40 MG capsule   Hyperglycemia       Relevant Orders   Hemoglobin A1c       Meds ordered this encounter  Medications   losartan-hydrochlorothiazide (HYZAAR) 100-25 MG tablet    Sig: Take 1 tablet by mouth daily.    Dispense:  90 tablet    Refill:  1   FLUoxetine (PROZAC) 40 MG capsule    Sig: Take 1 capsule (40 mg total) by mouth daily.    Dispense:  90 capsule    Refill:  3    I, Ann Held, DO, personally preformed the services described in this documentation.  All medical record entries made by the scribe were at my direction and in my presence.  I have reviewed the chart and discharge instructions (if applicable) and agree that the record reflects my personal performance and is accurate and complete. 05/31/2022   I,Amber Collins,acting as a scribe for Ann Held, DO.,have documented all relevant documentation on the behalf of Ann Held, DO,as directed by  Ann Held, DO while in the presence of Ann Held, DO.   Ann Held, DO

## 2022-06-01 LAB — HEMOGLOBIN A1C
Hgb A1c MFr Bld: 5.3 % of total Hgb (ref <5.7)
Mean Plasma Glucose: 105 mg/dL
eAG (mmol/L): 5.8 mmol/L

## 2022-06-01 LAB — LIPID PANEL
Cholesterol: 151 mg/dL (ref <200)
HDL: 61 mg/dL (ref >=50)
LDL Cholesterol (Calc): 73 mg/dL (calc)
Non-HDL Cholesterol (Calc): 90 mg/dL (calc) (ref <130)
Total CHOL/HDL Ratio: 2.5 (calc) (ref <5.0)
Triglycerides: 88 mg/dL (ref <150)

## 2022-06-01 LAB — TSH: TSH: 1.93 mIU/L

## 2022-06-21 ENCOUNTER — Ambulatory Visit: Payer: Self-pay | Admitting: Family Medicine

## 2022-11-27 ENCOUNTER — Other Ambulatory Visit: Payer: Self-pay | Admitting: Family Medicine

## 2022-11-27 DIAGNOSIS — I10 Essential (primary) hypertension: Secondary | ICD-10-CM

## 2023-02-17 ENCOUNTER — Other Ambulatory Visit: Payer: Self-pay

## 2023-02-17 ENCOUNTER — Encounter (HOSPITAL_BASED_OUTPATIENT_CLINIC_OR_DEPARTMENT_OTHER): Payer: Self-pay

## 2023-02-17 ENCOUNTER — Emergency Department (HOSPITAL_BASED_OUTPATIENT_CLINIC_OR_DEPARTMENT_OTHER)
Admission: EM | Admit: 2023-02-17 | Discharge: 2023-02-17 | Disposition: A | Discharge status: Home / Self Care | Payer: Worker's Compensation | Attending: Emergency Medicine | Admitting: Emergency Medicine

## 2023-02-17 ENCOUNTER — Emergency Department (HOSPITAL_BASED_OUTPATIENT_CLINIC_OR_DEPARTMENT_OTHER): Payer: Worker's Compensation

## 2023-02-17 DIAGNOSIS — S8264XA Nondisplaced fracture of lateral malleolus of right fibula, initial encounter for closed fracture: Secondary | ICD-10-CM | POA: Insufficient documentation

## 2023-02-17 DIAGNOSIS — Z79899 Other long term (current) drug therapy: Secondary | ICD-10-CM | POA: Insufficient documentation

## 2023-02-17 DIAGNOSIS — S80212A Abrasion, left knee, initial encounter: Secondary | ICD-10-CM

## 2023-02-17 DIAGNOSIS — I160 Hypertensive urgency: Secondary | ICD-10-CM

## 2023-02-17 DIAGNOSIS — Y990 Civilian activity done for income or pay: Secondary | ICD-10-CM | POA: Insufficient documentation

## 2023-02-17 DIAGNOSIS — S82891A Other fracture of right lower leg, initial encounter for closed fracture: Secondary | ICD-10-CM

## 2023-02-17 DIAGNOSIS — Z23 Encounter for immunization: Secondary | ICD-10-CM | POA: Insufficient documentation

## 2023-02-17 DIAGNOSIS — W010XXA Fall on same level from slipping, tripping and stumbling without subsequent striking against object, initial encounter: Secondary | ICD-10-CM | POA: Insufficient documentation

## 2023-02-17 DIAGNOSIS — S8991XA Unspecified injury of right lower leg, initial encounter: Secondary | ICD-10-CM | POA: Diagnosis present

## 2023-02-17 MED ORDER — OXYCODONE-ACETAMINOPHEN 5-325 MG PO TABS: 1.0000 | ORAL_TABLET | Freq: Once | ORAL | Status: DC

## 2023-02-17 MED ORDER — HYDROCODONE-ACETAMINOPHEN 5-325 MG PO TABS: 1.0000 | ORAL_TABLET | Freq: Four times a day (QID) | ORAL | 0 refills | Status: DC | PRN

## 2023-02-17 MED ORDER — TETANUS-DIPHTH-ACELL PERTUSSIS 5-2.5-18.5 LF-MCG/0.5 IM SUSY
0.5000 mL | PREFILLED_SYRINGE | Freq: Once | INTRAMUSCULAR | Status: AC
  Administered 2023-02-17: 0.5 mL via INTRAMUSCULAR
  Filled 2023-02-17: qty 0.5

## 2023-02-17 NOTE — ED Triage Notes (Signed)
Pt arrives with c/o right ankle and left knee pain after falling. Pt denies LOC. Right ankle has obvious swelling and left knee has an abrasion from fall.

## 2023-02-17 NOTE — ED Notes (Signed)
Patient transported to X-ray 

## 2023-02-17 NOTE — Discharge Instructions (Addendum)
You have been evaluated for your injury.  It appears you have suffered a broken bone to your right ankle.  Please wear cam walker boot when ambulating and use crutches to help aid with your ambulation.  You may take opiate pain medication as needed for pain.  You may follow-up closely with orthopedist doctor for further care.  Your blood pressure is quite high today, please have it rechecked by your primary care doctor for further management.

## 2023-02-17 NOTE — ED Notes (Signed)
Discharge paperwork reviewed entirely with patient, including Rx's and follow up care. Pain was under control. Pt verbalized understanding as well as all parties involved. No questions or concerns voiced at the time of discharge. No acute distress noted.   Pt was wheeled out to the PVA in a wheelchair without incident.  

## 2023-02-17 NOTE — ED Notes (Signed)
Wound care performed, wound to left knee cleaned with dermal wound cleaner, bacitracin applied, telfa applied, and secured with Coban.  Pt tolerated well.

## 2023-02-17 NOTE — ED Provider Notes (Signed)
[facility] Provider Note   CSN: [id number] Arrival date & time: 02/17/23  1836     History  Chief Complaint  Patient presents with   Ankle Pain   Knee Pain    [name] is a 52 y.o. female.  The history is provided by the patient and medical records. No language interpreter was used.  Ankle Pain Knee Pain    52 year old female with significant history of hypertension, anxiety, who presents for evaluation of a fall.  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  She denies hitting her head or loss of consciousness.  She reported cute onset of sharp throbbing pain to the right ankle as well as her left knee and unable to bear weight on the affected area.  She was requiring help to get home.  She did took 3 ibuprofen approximately an hour ago which did provide some relief.  When not moving her pain is under control but she endorsed increasing sharp shooting pain to the affected area with movement.  She denies any numbness denies any other injury.  She is unsure her last tetanus status.  She has a history of high blood pressure and did take blood pressure medication today.  Patient denies any precipitating symptoms prior to the fall.  Home Medications Prior to Admission medications   Medication Sig Start Date End Date Taking? Authorizing Provider  ALPRAZolam Duanne Moron) 0.5 MG tablet Take 1 tablet (0.5 mg total) by mouth 3 (three) times daily as needed. 03/05/19   Ann Held, DO  FLUoxetine (PROZAC) 40 MG capsule Take 1 capsule (40 mg total) by mouth daily. 05/31/22   Ann Held, DO  losartan-hydrochlorothiazide (HYZAAR) 100-25 MG tablet Take 1 tablet by mouth once daily 11/30/22   Carollee Herter, Alferd Apa, DO      Allergies    Metoclopramide    Review of Systems   Review of Systems  All other systems reviewed  and are negative.   Physical Exam Updated Vital Signs BP (!) 213/85 (BP Location: Right Arm)   Pulse 77   Temp 98.4 F (36.9 C) (Oral)   Resp 18   Wt 91.2 kg   SpO2 98%   BMI 39.26 kg/m  Physical Exam Vitals and nursing note reviewed.  Constitutional:      General: She is not in acute distress.    Appearance: She is well-developed.  HENT:     Head: Normocephalic and atraumatic.  Eyes:     Conjunctiva/sclera: Conjunctivae normal.  Pulmonary:     Effort: Pulmonary effort is normal.  Musculoskeletal:        General: Tenderness (Right ankle: Edema and tenderness noted to lateral malleoli region with decreased ankle range of motion secondary to pain no deformity.  Intact status pedis pulse.  No pain at fifth metatarsal) present.     Cervical back: Neck supple.     Comments: Left knee: Skin abrasion noted to anterior knee with associated swelling and tenderness.  Patella is located.  Able to flex and extend the knee.  No joint laxity.  No foreign body noted.  Skin:    Findings: No rash.  Neurological:     Mental Status: She is alert and oriented to person, place, and time.  Psychiatric:        Mood and Affect: Mood normal.  ED Results / Procedures / Treatments   Labs (all labs ordered are listed, but only abnormal results are displayed) Labs Reviewed - No data to display  EKG None  Radiology DG Knee Complete 4 Views Left  Result Date: 02/17/2023 CLINICAL DATA:  52 year old female presents following fall. EXAM: LEFT KNEE - COMPLETE 4+ VIEW COMPARISON:  September of 2019. FINDINGS: Enthesopathic changes upon the patella. No signs of joint effusion. No visible fracture or sign of dislocation. IMPRESSION: Degenerative change of the knee without evidence of fracture or dislocation. Electronically Signed   By: Zetta Bills M.D.   On: 02/17/2023 19:42   DG Ankle Complete Right  Result Date: 02/17/2023 CLINICAL DATA:  Ankle pain following fall. EXAM: RIGHT ANKLE - COMPLETE  3+ VIEW COMPARISON:  None available. FINDINGS: Abundant soft tissue swelling over the lateral malleolus. Lucency traveling obliquely through the malleolus may be present and there is also evidence of prior avulsion injury. Lucency cannot be confirmed on lateral projection or AP projection. Ankle mortise is intact. No additional signs of fracture. Degenerative changes about the midfoot incidentally noted. IMPRESSION: Suspect nondisplaced lateral malleolar fracture with abundant overlying soft tissue swelling. Signs of prior injury to the lateral malleolus as well, difficult to exclude the possibility of acute avulsion injury in addition to or is an alternative diagnosis. Electronically Signed   By: Zetta Bills M.D.   On: 02/17/2023 19:41    Procedures Procedures    Medications Ordered in ED Medications  oxyCODONE-acetaminophen (PERCOCET/ROXICET) 5-325 MG per tablet 1 tablet (has no administration in time range)  Tdap (BOOSTRIX) injection 0.5 mL (0.5 mLs Intramuscular Given 02/17/23 1925)    ED Course/ Medical Decision Making/ A&P                             Medical Decision Making Amount and/or Complexity of Data Reviewed Radiology: ordered.  Risk Prescription drug management.   BP (!) 213/85 (BP Location: Right Arm)   Pulse 77   Temp 98.4 F (36.9 C) (Oral)   Resp 18   Wt 91.2 kg   SpO2 98%   BMI 39.26 kg/m   27:42 PM 52 year old female with significant history of hypertension, anxiety, who presents for evaluation of a fall.  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  She denies hitting her head or loss of consciousness.  She reported cute onset of sharp throbbing pain to the right ankle as well as her left knee and unable to bear weight on the affected area.  She was requiring help to get home.  She did took 3 ibuprofen approximately an hour ago which did provide some  relief.  When not moving her pain is under control but she endorsed increasing sharp shooting pain to the affected area with movement.  She denies any numbness denies any other injury.  She is unsure her last tetanus status.  She has a history of high blood pressure and did take blood pressure medication today.  Patient denies any precipitating symptoms prior to the fall.  On exam, patient is sitting with her right leg propped up on the chair.  Her right ankle is remarkable for tenderness and edema noted to lateral malleoli region with decreased range of motion but no obvious deformity or crepitus noted.  Left anterior knee is notable for skin abrasion and swelling  but patella is located and able to flex and extend knee.  No other injury noted.  Vital signs notable for elevated blood pressure of 213/85.  This is likely in the setting of pain and poorly controlled hypertension.  Doubt hypertensive emergency causing her injury.  I offered additional pain medication but patient declined.  Will update tetanus, will obtain x-ray of right ankle and left knee.  Will provide wound care management to left anterior knee.  8:00 PM X-ray of the right ankle and left knee was obtained independently viewed interpreted by me and I agree with radiology interpretation.  X-ray of the right ankle shows suspected nondisplaced lateral malleolus fracture with abundant overlying soft tissue swelling.  Felt this is consistent with patient presentation.  Will place ankle in a cam walker boot and will provide crutches for support.  Opiate pain medication offered, pt declined. X-ray of the left knee without any acute fracture.  Will apply Ace wrap.        Final Clinical Impression(s) / ED Diagnoses Final diagnoses:  Closed right malleolar fracture, initial encounter  Abrasion of left knee, initial encounter  Hypertensive urgency    Rx / DC Orders ED Discharge Orders          Ordered    HYDROcodone-acetaminophen  (NORCO/VICODIN) 5-325 MG tablet  Every 6 hours PRN        02/17/23 2020              Domenic Moras, PA-C 02/17/23 2021    Tegeler, Gwenyth Allegra, MD 02/17/23 2046

## 2023-04-18 ENCOUNTER — Other Ambulatory Visit: Payer: Self-pay | Admitting: Family Medicine

## 2023-04-18 DIAGNOSIS — I10 Essential (primary) hypertension: Secondary | ICD-10-CM

## 2023-06-20 ENCOUNTER — Other Ambulatory Visit: Payer: Self-pay | Admitting: Family Medicine

## 2023-06-20 ENCOUNTER — Encounter: Payer: Self-pay | Admitting: *Deleted

## 2023-06-20 DIAGNOSIS — F419 Anxiety disorder, unspecified: Secondary | ICD-10-CM

## 2023-06-23 ENCOUNTER — Encounter: Payer: Self-pay | Admitting: Family Medicine

## 2023-06-23 ENCOUNTER — Ambulatory Visit (INDEPENDENT_AMBULATORY_CARE_PROVIDER_SITE_OTHER): Payer: Self-pay | Admitting: Family Medicine

## 2023-06-23 VITALS — BP 160/100 | HR 61 | Temp 98.7°F | Ht 60.0 in | Wt 198.2 lb

## 2023-06-23 DIAGNOSIS — F419 Anxiety disorder, unspecified: Secondary | ICD-10-CM

## 2023-06-23 DIAGNOSIS — I10 Essential (primary) hypertension: Secondary | ICD-10-CM

## 2023-06-23 DIAGNOSIS — Z0000 Encounter for general adult medical examination without abnormal findings: Secondary | ICD-10-CM

## 2023-06-23 DIAGNOSIS — F411 Generalized anxiety disorder: Secondary | ICD-10-CM | POA: Insufficient documentation

## 2023-06-23 MED ORDER — ALPRAZOLAM 0.5 MG PO TABS: 0.5000 mg | ORAL_TABLET | Freq: Three times a day (TID) | ORAL | 1 refills | Status: DC | PRN

## 2023-06-23 MED ORDER — AMLODIPINE BESYLATE 5 MG PO TABS: 5.0000 mg | ORAL_TABLET | Freq: Every day | ORAL | 0 refills | Status: DC

## 2023-06-23 NOTE — Progress Notes (Signed)
Established Patient Office Visit  Subjective   Patient ID: [name], female    DOB: Apr 07, 1971  Age: 52 y.o. MRN: [id number]  Chief Complaint  Patient presents with   Annual Exam    Pt would like a new Rx for Xanax     HPI Discussed the use of AI scribe software for clinical note transcription with the patient, who gave verbal consent to proceed.  History of Present Illness   The patient, with a history of hypertension and depression, presents for a physical exam. She reports that her blood pressure is 'sky high today' and attributes this to 'a lot of stress' due to a recent 'traumatic life change.' She denies chest pain and has not taken her blood pressure medication today due to fasting for blood work. She reports no changes in lifestyle, including diet and exercise, and is 'even exercising an hour a day and eating fruits and vegetables.'  The patient also reports a recent fracture from a fall during a field trip. 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  In addition, the patient has recently lost both parents. Her mother had diabetes, heart problems, and was on dialysis, likely secondary to the diabetes. She was in her early 70s at the time of her death. Her father died a year prior, also in his early 2s, from cirrhosis.  The patient is currently uninsured due to a change in income and is struggling to afford necessary medical care, including follow-up for a mammogram that showed 'dense something.' She expresses frustration with the cost of healthcare and the barriers it presents to receiving necessary care.      Patient Active Problem List   Diagnosis Date Noted   Generalized anxiety disorder 06/23/2023   Need for influenza vaccination 09/05/2018   Preventative health care 12/14/2016   Eczema, dyshidrotic 12/14/2016   HTN (hypertension) 01/26/2016   Anxiety 01/29/2015   Viral URI 10/20/2012   FATIGUE 09/29/2010    FREQUENCY, URINARY 09/29/2010   Past Medical History:  Diagnosis Date   Anxiety    H/O endometritis    Hypertension    Past Surgical History:  Procedure Laterality Date   ABDOMINAL HYSTERECTOMY  05/05/2011   TAH---fibroids and endometriosis   CHOLECYSTECTOMY  02/2010   Social History   Tobacco Use   Smoking status: Former    Current packs/day: 0.00    Types: Cigarettes    Quit date: 11/29/1988    Years since quitting: 34.5   Smokeless tobacco: Never  Substance Use Topics   Alcohol use: Yes    Comment: rare wine use   Drug use: No   Social History   Socioeconomic History   Marital status: Married    Spouse name: Not on file   Number of children: Not on file   Years of education: Not on file   Highest education level: Not on file  Occupational History   Occupation: Runner, broadcasting/film/video    Employer: [employer]    Comment: [employer]  Tobacco Use   Smoking status: Former    Current packs/day: 0.00    Types: Cigarettes    Quit date: 11/29/1988    Years since quitting: 34.5   Smokeless tobacco: Never  Substance and Sexual Activity   Alcohol use: Yes    Comment: rare wine use   Drug use: No   Sexual activity: Yes    Partners: Male  Other Topics Concern   Not  on file  Social History Narrative   Exercising -- swimming, walking    Social Determinants of Health   Financial Resource Strain: Not on file  Food Insecurity: Not on file  Transportation Needs: Not on file  Physical Activity: Not on file  Stress: Not on file  Social Connections: Unknown (04/09/2022)   [facility]   Social Network    Social Network: Not on file  Intimate Partner Violence: Unknown (03/02/2022)   Received from [facility]   HITS    Physically Hurt: Not on file    Insult or Talk Down To: Not on file    Threaten Physical Harm: Not on file    Scream or Curse: Not on file   Family Status  Relation Name Status   Mother  Deceased at age 93   Father  Deceased at age 78    PGM  Deceased   PGF  (Not Specified)   Dennie Bible Uncle  Deceased  No partnership data on file   Family History  Problem Relation Age of Onset   Heart disease Mother    Hypertension Mother    Diabetes Mother    Kidney disease Mother    Hypertension Father    Heart disease Father    Rheum arthritis Father    Cirrhosis Father    Heart disease Paternal Grandmother    Breast cancer Paternal Grandmother    Osteoarthritis Paternal Grandfather    Cancer Paternal Uncle        liver with mets    Allergies  Allergen Reactions   Metoclopramide Anxiety      Review of Systems  Constitutional:  Negative for fever and malaise/fatigue.  HENT:  Negative for congestion.   Eyes:  Negative for blurred vision.  Respiratory:  Negative for shortness of breath.   Cardiovascular:  Negative for chest pain, palpitations and leg swelling.  Gastrointestinal:  Negative for abdominal pain, blood in stool and nausea.  Genitourinary:  Negative for dysuria and frequency.  Musculoskeletal:  Negative for falls.  Skin:  Negative for rash.  Neurological:  Negative for dizziness, loss of consciousness and headaches.  Endo/Heme/Allergies:  Negative for environmental allergies.  Psychiatric/Behavioral:  Negative for depression. The patient is not nervous/anxious.       Objective:     BP (!) 160/100   Pulse 61   Temp 98.7 F (37.1 C) (Oral)   Ht 5' (1.524 m)   Wt 198 lb 3.2 oz (89.9 kg)   SpO2 98%   BMI 38.71 kg/m  BP Readings from Last 3 Encounters:  06/23/23 (!) 160/100  02/17/23 (!) 211/86  05/31/22 (!) 200/90   Wt Readings from Last 3 Encounters:  06/23/23 198 lb 3.2 oz (89.9 kg)  02/17/23 201 lb (91.2 kg)  05/31/22 201 lb 3.2 oz (91.3 kg)   SpO2 Readings from Last 3 Encounters:  06/23/23 98%  02/17/23 97%  05/31/22 96%      Physical Exam Vitals and nursing note reviewed.  Constitutional:      General: She is not in acute distress.    Appearance: Normal appearance. She is  well-developed.  HENT:     Head: Normocephalic and atraumatic.  Eyes:     General: No scleral icterus.       Right eye: No discharge.        Left eye: No discharge.  Cardiovascular:     Rate and Rhythm: Normal rate and regular rhythm.     Heart sounds: No murmur heard.  Pulmonary:     Effort: Pulmonary effort is normal. No respiratory distress.     Breath sounds: Normal breath sounds.  Musculoskeletal:        General: Normal range of motion.     Cervical back: Normal range of motion and neck supple.     Right lower leg: No edema.     Left lower leg: No edema.  Skin:    General: Skin is warm and dry.  Neurological:     General: No focal deficit present.     Mental Status: She is alert and oriented to person, place, and time.  Psychiatric:        Mood and Affect: Mood normal.        Behavior: Behavior normal.        Thought Content: Thought content normal.        Judgment: Judgment normal.      No results found for any visits on 06/23/23.  Last CBC Lab Results  Component Value Date   WBC 8.7 05/22/2022   HGB 13.0 05/22/2022   HCT 38.3 05/22/2022   MCV 83.6 05/22/2022   MCH 28.4 05/22/2022   RDW 12.8 05/22/2022   PLT 307 05/22/2022   Last metabolic panel Lab Results  Component Value Date   GLUCOSE 115 (H) 05/22/2022   NA 140 05/22/2022   K 3.4 (L) 05/22/2022   CL 106 05/22/2022   CO2 28 05/22/2022   BUN 15 05/22/2022   CREATININE 0.91 05/22/2022   GFRNONAA >60 05/22/2022   CALCIUM 9.0 05/22/2022   PROT 7.3 05/22/2022   ALBUMIN 3.8 05/22/2022   BILITOT 0.5 05/22/2022   ALKPHOS 134 (H) 05/22/2022   AST 25 05/22/2022   ALT 24 05/22/2022   ANIONGAP 6 05/22/2022   Last lipids Lab Results  Component Value Date   CHOL 151 05/31/2022   HDL 61 05/31/2022   LDLCALC 73 05/31/2022   TRIG 88 05/31/2022   CHOLHDL 2.5 05/31/2022   Last hemoglobin A1c Lab Results  Component Value Date   HGBA1C 5.3 05/31/2022   Last thyroid functions Lab Results  Component  Value Date   TSH 1.93 05/31/2022   T4TOTAL 8.6 01/26/2016   Last vitamin D No results found for: "25OHVITD2", "25OHVITD3", "VD25OH" Last vitamin B12 and Folate Lab Results  Component Value Date   VITAMINB12 615 08/31/2012   FOLATE 9.2 09/29/2010      The 10-year ASCVD risk score (Arnett DK, et al., 2019) is: 2%    Assessment & Plan:   Problem List Items Addressed This Visit       Unprioritized   Preventative health care - Primary    Ghm utd Check labs  See AVS  Health Maintenance  Topic Date Due   Hepatitis C Screening  Never done   Colonoscopy  Never done   MAMMOGRAM  08/02/2019   Zoster Vaccines- Shingrix (1 of 2) Never done   HIV Screening  08/01/2024 (Originally 08/22/1986)   INFLUENZA VACCINE  06/30/2023   DTaP/Tdap/Td (4 - Td or Tdap) 02/16/2033   HPV VACCINES  Aged Out   COVID-19 Vaccine  Discontinued         Relevant Orders   CBC with Differential/Platelet   Comprehensive metabolic panel   Lipid panel   TSH   HTN (hypertension)    Poorly controlled will alter medications, encouraged DASH diet, minimize caffeine and obtain adequate sleep. Report concerning symptoms and follow up as directed and as needed  Add norvasc 5 mg daily  to losartan 100/25        Relevant Medications   amLODipine (NORVASC) 5 MG tablet   Generalized anxiety disorder   Relevant Medications   ALPRAZolam (XANAX) 0.5 MG tablet   Anxiety    Cont prozac  Restart xanax  F/u 1 month or sooner as needed       Relevant Medications   ALPRAZolam (XANAX) 0.5 MG tablet  Assessment and Plan    Hypertension: Elevated blood pressure in the office today. Patient reports high stress levels and did not take her antihypertensive medication this morning. She is currently exercising regularly and maintaining a healthy diet. -Recheck blood pressure in office today. -Consider adding additional antihypertensive medication if blood pressure remains elevated.  Anxiety: Patient reports high  stress levels and requests a refill of Xanax to help with sleep. -Refill Xanax prescription.  General Health Maintenance: Patient reports lack of insurance and difficulty affording necessary medical care, including mammograms and eye exams. -Advise patient to speak with front office about sliding scale payment options. -Recommend looking into Vision Service Plan (VSP) for affordable vision insurance.  Family History: Recent updates to family history with both parents deceased. Mother had diabetes, heart disease, and kidney disease requiring dialysis. Father had cirrhosis. -No immediate action, but keep in mind for future risk assessment.  Fracture: Recent fracture from a fall. No current complaints related to this. -No immediate action needed.        Return in about 4 weeks (around 07/21/2023).    Donato Schultz, DO

## 2023-06-23 NOTE — Patient Instructions (Signed)
Preventive Care 40-53 Years Old, Female Preventive care refers to lifestyle choices and visits with your health care provider that can promote health and wellness. Preventive care visits are also called wellness exams. What can I expect for my preventive care visit? Counseling Your health care provider may ask you questions about your: Medical history, including: Past medical problems. Family medical history. Pregnancy history. Current health, including: Menstrual cycle. Method of birth control. Emotional well-being. Home life and relationship well-being. Sexual activity and sexual health. Lifestyle, including: Alcohol, nicotine or tobacco, and drug use. Access to firearms. Diet, exercise, and sleep habits. Work and work environment. Sunscreen use. Safety issues such as seatbelt and bike helmet use. Physical exam Your health care provider will check your: Height and weight. These may be used to calculate your BMI (body mass index). BMI is a measurement that tells if you are at a healthy weight. Waist circumference. This measures the distance around your waistline. This measurement also tells if you are at a healthy weight and may help predict your risk of certain diseases, such as type 2 diabetes and high blood pressure. Heart rate and blood pressure. Body temperature. Skin for abnormal spots. What immunizations do I need?  Vaccines are usually given at various ages, according to a schedule. Your health care provider will recommend vaccines for you based on your age, medical history, and lifestyle or other factors, such as travel or where you work. What tests do I need? Screening Your health care provider may recommend screening tests for certain conditions. This may include: Lipid and cholesterol levels. Diabetes screening. This is done by checking your blood sugar (glucose) after you have not eaten for a while (fasting). Pelvic exam and Pap test. Hepatitis B test. Hepatitis C  test. HIV (human immunodeficiency virus) test. STI (sexually transmitted infection) testing, if you are at risk. Lung cancer screening. Colorectal cancer screening. Mammogram. Talk with your health care provider about when you should start having regular mammograms. This may depend on whether you have a family history of breast cancer. BRCA-related cancer screening. This may be done if you have a family history of breast, ovarian, tubal, or peritoneal cancers. Bone density scan. This is done to screen for osteoporosis. Talk with your health care provider about your test results, treatment options, and if necessary, the need for more tests. Follow these instructions at home: Eating and drinking  Eat a diet that includes fresh fruits and vegetables, whole grains, lean protein, and low-fat dairy products. Take vitamin and mineral supplements as recommended by your health care provider. Do not drink alcohol if: Your health care provider tells you not to drink. You are pregnant, may be pregnant, or are planning to become pregnant. If you drink alcohol: Limit how much you have to 0-1 drink a day. Know how much alcohol is in your drink. In the [place] one drink equals one 12 oz bottle of beer (355 mL), one 5 oz glass of wine (148 mL), or one 1 oz glass of hard liquor (44 mL). Lifestyle Brush your teeth every morning and night with fluoride toothpaste. Floss one time each day. Exercise for at least 30 minutes 5 or more days each week. Do not use any products that contain nicotine or tobacco. These products include cigarettes, chewing tobacco, and vaping devices, such as e-cigarettes. If you need help quitting, ask your health care provider. Do not use drugs. If you are sexually active, practice safe sex. Use a condom or other form of protection to   prevent STIs. If you do not wish to become pregnant, use a form of birth control. If you plan to become pregnant, see your health care provider for a  prepregnancy visit. Take aspirin only as told by your health care provider. Make sure that you understand how much to take and what form to take. Work with your health care provider to find out whether it is safe and beneficial for you to take aspirin daily. Find healthy ways to manage stress, such as: Meditation, yoga, or listening to music. Journaling. Talking to a trusted person. Spending time with friends and family. Minimize exposure to UV radiation to reduce your risk of skin cancer. Safety Always wear your seat belt while driving or riding in a vehicle. Do not drive: If you have been drinking alcohol. Do not ride with someone who has been drinking. When you are tired or distracted. While texting. If you have been using any mind-altering substances or drugs. Wear a helmet and other protective equipment during sports activities. If you have firearms in your house, make sure you follow all gun safety procedures. Seek help if you have been physically or sexually abused. What's next? Visit your health care provider once a year for an annual wellness visit. Ask your health care provider how often you should have your eyes and teeth checked. Stay up to date on all vaccines. This information is not intended to replace advice given to you by your health care provider. Make sure you discuss any questions you have with your health care provider. Document Revised: 05/13/2021 Document Reviewed: 05/13/2021 Elsevier Patient Education  2024 [employer] Inc[employer]  

## 2023-06-23 NOTE — Assessment & Plan Note (Signed)
Poorly controlled will alter medications, encouraged DASH diet, minimize caffeine and obtain adequate sleep. Report concerning symptoms and follow up as directed and as needed  Add norvasc 5 mg daily to losartan 100/25

## 2023-06-23 NOTE — Assessment & Plan Note (Signed)
Ghm utd Check labs  See AVS  Health Maintenance  Topic Date Due   Hepatitis C Screening  Never done   Colonoscopy  Never done   MAMMOGRAM  08/02/2019   Zoster Vaccines- Shingrix (1 of 2) Never done   HIV Screening  08/01/2024 (Originally 08/22/1986)   INFLUENZA VACCINE  06/30/2023   DTaP/Tdap/Td (4 - Td or Tdap) 02/16/2033   HPV VACCINES  Aged Out   COVID-19 Vaccine  Discontinued

## 2023-06-23 NOTE — Assessment & Plan Note (Signed)
Cont prozac  Restart xanax  F/u 1 month or sooner as needed

## 2023-06-24 LAB — CBC WITH DIFFERENTIAL/PLATELET: Eosinophils Relative: 2.8 % (ref 0.0–5.0)

## 2023-06-24 LAB — LIPID PANEL
HDL: 59.5 mg/dL (ref >39.00)
NonHDL: 105.58

## 2023-06-24 LAB — COMPREHENSIVE METABOLIC PANEL
ALT: 24 U/L (ref 0–35)
Chloride: 101 mEq/L (ref 96–112)
Total Protein: 7.1 g/dL (ref 6.0–8.3)

## 2023-07-07 ENCOUNTER — Ambulatory Visit: Payer: Self-pay

## 2023-07-15 ENCOUNTER — Other Ambulatory Visit: Payer: Self-pay | Admitting: Family Medicine

## 2023-07-15 DIAGNOSIS — I10 Essential (primary) hypertension: Secondary | ICD-10-CM

## 2023-07-16 ENCOUNTER — Other Ambulatory Visit: Payer: Self-pay | Admitting: Family Medicine

## 2023-07-16 DIAGNOSIS — F419 Anxiety disorder, unspecified: Secondary | ICD-10-CM

## 2023-07-18 ENCOUNTER — Other Ambulatory Visit: Payer: Self-pay | Admitting: Family Medicine

## 2023-07-20 ENCOUNTER — Ambulatory Visit (INDEPENDENT_AMBULATORY_CARE_PROVIDER_SITE_OTHER): Payer: Self-pay

## 2023-07-20 DIAGNOSIS — I10 Essential (primary) hypertension: Secondary | ICD-10-CM

## 2023-07-20 NOTE — Progress Notes (Signed)
Pt here for Blood pressure check per PCP  Pt currently takes: Amlodipine and Losatan    Pt reports compliance with medication.  BP today @ = 154/92 L arm R arm 142/86 HR = 73  Pt was given the option from Dr. Carmelia Roller to up her dosage of amlodipine or wait a few weeks to see how current dosage is doing and follow up with PCP. Pt stated she would not like to up dosage of medicine at this time and would like like to meet with PCP. Scheduled pt a follow up appointment 08/04/2023.

## 2023-08-04 ENCOUNTER — Ambulatory Visit: Payer: Self-pay | Admitting: Family Medicine

## 2023-08-17 ENCOUNTER — Other Ambulatory Visit: Payer: Self-pay | Admitting: Family Medicine

## 2023-08-17 DIAGNOSIS — F419 Anxiety disorder, unspecified: Secondary | ICD-10-CM

## 2023-10-11 ENCOUNTER — Other Ambulatory Visit: Payer: Self-pay | Admitting: Family Medicine

## 2023-10-11 DIAGNOSIS — F419 Anxiety disorder, unspecified: Secondary | ICD-10-CM

## 2023-10-16 ENCOUNTER — Other Ambulatory Visit: Payer: Self-pay | Admitting: Family Medicine

## 2023-10-16 DIAGNOSIS — I10 Essential (primary) hypertension: Secondary | ICD-10-CM

## 2023-11-29 ENCOUNTER — Other Ambulatory Visit: Payer: Self-pay | Admitting: Family Medicine

## 2023-11-29 DIAGNOSIS — F419 Anxiety disorder, unspecified: Secondary | ICD-10-CM

## 2024-01-04 ENCOUNTER — Other Ambulatory Visit: Payer: Self-pay | Admitting: Family Medicine

## 2024-01-04 DIAGNOSIS — F419 Anxiety disorder, unspecified: Secondary | ICD-10-CM

## 2024-01-07 ENCOUNTER — Other Ambulatory Visit: Payer: Self-pay | Admitting: Family Medicine

## 2024-01-07 DIAGNOSIS — F419 Anxiety disorder, unspecified: Secondary | ICD-10-CM

## 2024-01-09 ENCOUNTER — Encounter: Payer: Self-pay | Admitting: Family Medicine

## 2024-01-09 DIAGNOSIS — I10 Essential (primary) hypertension: Secondary | ICD-10-CM

## 2024-01-10 MED ORDER — LOSARTAN POTASSIUM-HCTZ 100-25 MG PO TABS: 1.0000 | ORAL_TABLET | Freq: Every day | ORAL | 0 refills | Status: DC

## 2024-01-10 MED ORDER — AMLODIPINE BESYLATE 5 MG PO TABS: 5.0000 mg | ORAL_TABLET | Freq: Every day | ORAL | 0 refills | Status: DC

## 2024-01-25 ENCOUNTER — Encounter: Payer: Self-pay | Admitting: Family Medicine

## 2024-01-25 DIAGNOSIS — F419 Anxiety disorder, unspecified: Secondary | ICD-10-CM

## 2024-01-25 MED ORDER — FLUOXETINE HCL 40 MG PO CAPS
40.0000 mg | ORAL_CAPSULE | Freq: Every day | ORAL | 0 refills | Status: DC
Start: 2024-01-25 — End: 2024-02-10

## 2024-01-30 ENCOUNTER — Ambulatory Visit: Payer: Self-pay | Admitting: Family Medicine

## 2024-02-10 ENCOUNTER — Ambulatory Visit (INDEPENDENT_AMBULATORY_CARE_PROVIDER_SITE_OTHER): Payer: Self-pay | Admitting: Family Medicine

## 2024-02-10 ENCOUNTER — Encounter: Payer: Self-pay | Admitting: Family Medicine

## 2024-02-10 ENCOUNTER — Other Ambulatory Visit: Payer: Self-pay | Admitting: Family Medicine

## 2024-02-10 DIAGNOSIS — F411 Generalized anxiety disorder: Secondary | ICD-10-CM

## 2024-02-10 DIAGNOSIS — I10 Essential (primary) hypertension: Secondary | ICD-10-CM

## 2024-02-10 DIAGNOSIS — F419 Anxiety disorder, unspecified: Secondary | ICD-10-CM

## 2024-02-10 MED ORDER — AMLODIPINE BESYLATE 5 MG PO TABS
5.0000 mg | ORAL_TABLET | Freq: Every day | ORAL | 1 refills | Status: DC
Start: 2024-02-10 — End: 2024-06-22

## 2024-02-10 MED ORDER — FLUOXETINE HCL 40 MG PO CAPS
40.0000 mg | ORAL_CAPSULE | Freq: Every day | ORAL | 3 refills | Status: DC
Start: 2024-02-10 — End: 2024-06-22

## 2024-02-10 MED ORDER — ALPRAZOLAM 0.5 MG PO TABS: 0.5000 mg | ORAL_TABLET | Freq: Three times a day (TID) | ORAL | 1 refills | Status: DC | PRN

## 2024-02-10 MED ORDER — LOSARTAN POTASSIUM-HCTZ 100-25 MG PO TABS: 1.0000 | ORAL_TABLET | Freq: Every day | ORAL | 1 refills | Status: DC
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
## 2024-02-10 NOTE — Assessment & Plan Note (Signed)
 Well controlled, no changes to meds. Encouraged heart healthy diet such as the DASH diet and exercise as tolerated.

## 2024-02-10 NOTE — Assessment & Plan Note (Signed)
 Con't meds Con't counseling

## 2024-02-10 NOTE — Patient Instructions (Signed)

## 2024-02-11 LAB — COMPREHENSIVE METABOLIC PANEL
AG Ratio: 1.6 (calc) (ref 1.0–2.5)
ALT: 17 U/L (ref 6–29)
AST: 20 U/L (ref 10–35)
Albumin: 4.3 g/dL (ref 3.6–5.1)
Alkaline phosphatase (APISO): 120 U/L (ref 37–153)
BUN: 18 mg/dL (ref 7–25)
CO2: 32 mmol/L (ref 20–32)
Calcium: 9.3 mg/dL (ref 8.6–10.4)
Chloride: 101 mmol/L (ref 98–110)
Creat: 0.76 mg/dL (ref 0.50–1.03)
Globulin: 2.7 g/dL (ref 1.9–3.7)
Glucose, Bld: 97 mg/dL (ref 65–99)
Potassium: 3.1 mmol/L — ABNORMAL LOW (ref 3.5–5.3)
Sodium: 142 mmol/L (ref 135–146)
Total Bilirubin: 0.4 mg/dL (ref 0.2–1.2)
Total Protein: 7 g/dL (ref 6.1–8.1)

## 2024-02-11 LAB — TSH: TSH: 1.46 m[IU]/L

## 2024-02-11 LAB — CBC WITH DIFFERENTIAL/PLATELET
Absolute Lymphocytes: 2102 {cells}/uL (ref 850–3900)
Absolute Monocytes: 409 {cells}/uL (ref 200–950)
Basophils Absolute: 51 {cells}/uL (ref 0–200)
Basophils Relative: 0.7 %
Eosinophils Absolute: 183 {cells}/uL (ref 15–500)
Eosinophils Relative: 2.5 %
HCT: 38.4 % (ref 35.0–45.0)
Hemoglobin: 12.6 g/dL (ref 11.7–15.5)
MCH: 28.6 pg (ref 27.0–33.0)
MCHC: 32.8 g/dL (ref 32.0–36.0)
MCV: 87.3 fL (ref 80.0–100.0)
MPV: 10 fL (ref 7.5–12.5)
Monocytes Relative: 5.6 %
Neutro Abs: 4555 {cells}/uL (ref 1500–7800)
Neutrophils Relative %: 62.4 %
Platelets: 306 10*3/uL (ref 140–400)
RBC: 4.4 10*6/uL (ref 3.80–5.10)
RDW: 12.7 % (ref 11.0–15.0)
Total Lymphocyte: 28.8 %
WBC: 7.3 10*3/uL (ref 3.8–10.8)

## 2024-02-11 LAB — LIPID PANEL
Cholesterol: 159 mg/dL (ref <200)
HDL: 53 mg/dL (ref >=50)
LDL Cholesterol (Calc): 76 mg/dL
Non-HDL Cholesterol (Calc): 106 mg/dL (ref <130)
Total CHOL/HDL Ratio: 3 (calc) (ref <5.0)
Triglycerides: 200 mg/dL — ABNORMAL HIGH (ref <150)

## 2024-02-17 ENCOUNTER — Encounter: Payer: Self-pay | Admitting: Family Medicine

## 2024-02-20 ENCOUNTER — Other Ambulatory Visit: Payer: Self-pay

## 2024-02-20 MED ORDER — POTASSIUM CHLORIDE CRYS ER 20 MEQ PO TBCR: 20.0000 meq | EXTENDED_RELEASE_TABLET | Freq: Every day | ORAL | 2 refills | Status: DC

## 2024-05-15 ENCOUNTER — Telehealth: Payer: Self-pay | Admitting: *Deleted

## 2024-05-15 NOTE — Telephone Encounter (Signed)
 Copied from CRM [id number]. Topic: Clinical - Medication Question >> May 15, 2024  3:16 PM [name] wrote: Reason for CRM: [facility] called to ask about prescription alprazolam - is there a follow up monitoring parameter or if there a time when controlled substance can be prescribed to pt  pharmacy can be reached - [phone]

## 2024-05-17 ENCOUNTER — Other Ambulatory Visit: Payer: Self-pay | Admitting: Family Medicine

## 2024-05-17 DIAGNOSIS — F411 Generalized anxiety disorder: Secondary | ICD-10-CM

## 2024-05-17 MED ORDER — ALPRAZOLAM 0.5 MG PO TABS: 0.5000 mg | ORAL_TABLET | Freq: Three times a day (TID) | ORAL | 1 refills | Status: DC | PRN

## 2024-05-17 MED ORDER — ALPRAZOLAM 0.5 MG PO TABS
0.5000 mg | ORAL_TABLET | Freq: Three times a day (TID) | ORAL | 1 refills | Status: DC | PRN
Start: 2024-05-17 — End: 2024-05-17

## 2024-06-22 ENCOUNTER — Encounter: Payer: Self-pay | Admitting: Family Medicine

## 2024-06-22 ENCOUNTER — Ambulatory Visit (INDEPENDENT_AMBULATORY_CARE_PROVIDER_SITE_OTHER): Payer: Self-pay | Admitting: Family Medicine

## 2024-06-22 VITALS — BP 144/100 | HR 70 | Temp 98.3°F | Resp 18 | Ht 60.0 in | Wt 182.8 lb

## 2024-06-22 DIAGNOSIS — I10 Essential (primary) hypertension: Secondary | ICD-10-CM

## 2024-06-22 DIAGNOSIS — F419 Anxiety disorder, unspecified: Secondary | ICD-10-CM

## 2024-06-22 DIAGNOSIS — Z0000 Encounter for general adult medical examination without abnormal findings: Secondary | ICD-10-CM

## 2024-06-22 DIAGNOSIS — F411 Generalized anxiety disorder: Secondary | ICD-10-CM

## 2024-06-22 LAB — CBC WITH DIFFERENTIAL/PLATELET
Basophils Absolute: 0.1 K/uL (ref 0.0–0.1)
Basophils Relative: 0.9 % (ref 0.0–3.0)
Eosinophils Absolute: 0.2 K/uL (ref 0.0–0.7)
Eosinophils Relative: 3.4 % (ref 0.0–5.0)
HCT: 37.5 % (ref 36.0–46.0)
Hemoglobin: 12.8 g/dL (ref 12.0–15.0)
Lymphocytes Relative: 29.4 % (ref 12.0–46.0)
Lymphs Abs: 1.7 K/uL (ref 0.7–4.0)
MCHC: 34.2 g/dL (ref 30.0–36.0)
MCV: 83.3 fl (ref 78.0–100.0)
Monocytes Absolute: 0.3 K/uL (ref 0.1–1.0)
Monocytes Relative: 5.2 % (ref 3.0–12.0)
Neutro Abs: 3.5 K/uL (ref 1.4–7.7)
Neutrophils Relative %: 61.1 % (ref 43.0–77.0)
Platelets: 270 K/uL (ref 150.0–400.0)
RBC: 4.5 Mil/uL (ref 3.87–5.11)
RDW: 13.2 % (ref 11.5–15.5)
WBC: 5.8 K/uL (ref 4.0–10.5)

## 2024-06-22 LAB — COMPREHENSIVE METABOLIC PANEL WITH GFR
ALT: 18 U/L (ref 0–35)
AST: 20 U/L (ref 0–37)
Albumin: 4.1 g/dL (ref 3.5–5.2)
Alkaline Phosphatase: 84 U/L (ref 39–117)
BUN: 14 mg/dL (ref 6–23)
CO2: 33 meq/L — ABNORMAL HIGH (ref 19–32)
Calcium: 9.3 mg/dL (ref 8.4–10.5)
Chloride: 102 meq/L (ref 96–112)
Creatinine, Ser: 0.78 mg/dL (ref 0.40–1.20)
GFR: 86.47 mL/min
Glucose, Bld: 101 mg/dL — ABNORMAL HIGH (ref 70–99)
Potassium: 3.2 meq/L — ABNORMAL LOW (ref 3.5–5.1)
Sodium: 143 meq/L (ref 135–145)
Total Bilirubin: 0.7 mg/dL (ref 0.2–1.2)
Total Protein: 6.9 g/dL (ref 6.0–8.3)

## 2024-06-22 LAB — LIPID PANEL
Cholesterol: 162 mg/dL (ref 0–200)
HDL: 61.2 mg/dL (ref >39.00)
LDL Cholesterol: 87 mg/dL (ref 0–99)
NonHDL: 101.23
Total CHOL/HDL Ratio: 3
Triglycerides: 69 mg/dL (ref 0.0–149.0)
VLDL: 13.8 mg/dL (ref 0.0–40.0)

## 2024-06-22 LAB — TSH: TSH: 1.72 u[IU]/mL (ref 0.35–5.50)

## 2024-06-22 MED ORDER — POTASSIUM CHLORIDE CRYS ER 20 MEQ PO TBCR: 20.0000 meq | EXTENDED_RELEASE_TABLET | Freq: Every day | ORAL | 2 refills | Status: DC

## 2024-06-22 MED ORDER — ALPRAZOLAM 0.5 MG PO TABS: 0.5000 mg | ORAL_TABLET | Freq: Three times a day (TID) | ORAL | 1 refills | Status: AC | PRN

## 2024-06-22 MED ORDER — LOSARTAN POTASSIUM-HCTZ 100-25 MG PO TABS: 1.0000 | ORAL_TABLET | Freq: Every day | ORAL | 1 refills | Status: AC

## 2024-06-22 MED ORDER — FLUOXETINE HCL 40 MG PO CAPS: 40.0000 mg | ORAL_CAPSULE | Freq: Every day | ORAL | 3 refills | Status: AC

## 2024-06-22 MED ORDER — AMLODIPINE BESYLATE 5 MG PO TABS: 5.0000 mg | ORAL_TABLET | Freq: Every day | ORAL | 1 refills | Status: AC

## 2024-06-22 NOTE — Assessment & Plan Note (Signed)
 Poorly controlled will alter medications, encouraged DASH diet, minimize caffeine and obtain adequate sleep. Report concerning symptoms and follow up as directed and as needed  Refill norvasc  Nurse visit in 2-3 weeks

## 2024-06-22 NOTE — Patient Instructions (Signed)
 Preventive Care 16-55 Years Old, Female  Preventive care refers to lifestyle choices and visits with your health care provider that can promote health and wellness. Preventive care visits are also called wellness exams.  What can I expect for my preventive care visit?  Counseling  Your health care provider may ask you questions about your:  Medical history, including:  Past medical problems.  Family medical history.  Pregnancy history.  Current health, including:  Menstrual cycle.  Method of birth control.  Emotional well-being.  Home life and relationship well-being.  Sexual activity and sexual health.  Lifestyle, including:  Alcohol, nicotine or tobacco, and drug use.  Access to firearms.  Diet, exercise, and sleep habits.  Work and work Astronomer.  Sunscreen use.  Safety issues such as seatbelt and bike helmet use.  Physical exam  Your health care provider will check your:  Height and weight. These may be used to calculate your BMI (body mass index). BMI is a measurement that tells if you are at a healthy weight.  Waist circumference. This measures the distance around your waistline. This measurement also tells if you are at a healthy weight and may help predict your risk of certain diseases, such as type 2 diabetes and high blood pressure.  Heart rate and blood pressure.  Body temperature.  Skin for abnormal spots.  What immunizations do I need?    Vaccines are usually given at various ages, according to a schedule. Your health care provider will recommend vaccines for you based on your age, medical history, and lifestyle or other factors, such as travel or where you work.  What tests do I need?  Screening  Your health care provider may recommend screening tests for certain conditions. This may include:  Lipid and cholesterol levels.  Diabetes screening. This is done by checking your blood sugar (glucose) after you have not eaten for a while (fasting).  Pelvic exam and Pap test.  Hepatitis B test.  Hepatitis C  test.  HIV (human immunodeficiency virus) test.  STI (sexually transmitted infection) testing, if you are at risk.  Lung cancer screening.  Colorectal cancer screening.  Mammogram. Talk with your health care provider about when you should start having regular mammograms. This may depend on whether you have a family history of breast cancer.  BRCA-related cancer screening. This may be done if you have a family history of breast, ovarian, tubal, or peritoneal cancers.  Bone density scan. This is done to screen for osteoporosis.  Talk with your health care provider about your test results, treatment options, and if necessary, the need for more tests.  Follow these instructions at home:  Eating and drinking    Eat a diet that includes fresh fruits and vegetables, whole grains, lean protein, and low-fat dairy products.  Take vitamin and mineral supplements as recommended by your health care provider.  Do not drink alcohol if:  Your health care provider tells you not to drink.  You are pregnant, may be pregnant, or are planning to become pregnant.  If you drink alcohol:  Limit how much you have to 0-1 drink a day.  Know how much alcohol is in your drink. In the [place] one drink equals one 12 oz bottle of beer (355 mL), one 5 oz glass of wine (148 mL), or one 1 oz glass of hard liquor (44 mL).  Lifestyle  Brush your teeth every morning and night with fluoride toothpaste. Floss one time each day.  Exercise for at least  30 minutes 5 or more days each week.  Do not use any products that contain nicotine or tobacco. These products include cigarettes, chewing tobacco, and vaping devices, such as e-cigarettes. If you need help quitting, ask your health care provider.  Do not use drugs.  If you are sexually active, practice safe sex. Use a condom or other form of protection to prevent STIs.  If you do not wish to become pregnant, use a form of birth control. If you plan to become pregnant, see your health care provider for a  prepregnancy visit.  Take aspirin only as told by your health care provider. Make sure that you understand how much to take and what form to take. Work with your health care provider to find out whether it is safe and beneficial for you to take aspirin daily.  Find healthy ways to manage stress, such as:  Meditation, yoga, or listening to music.  Journaling.  Talking to a trusted person.  Spending time with friends and family.  Minimize exposure to UV radiation to reduce your risk of skin cancer.  Safety  Always wear your seat belt while driving or riding in a vehicle.  Do not drive:  If you have been drinking alcohol. Do not ride with someone who has been drinking.  When you are tired or distracted.  While texting.  If you have been using any mind-altering substances or drugs.  Wear a helmet and other protective equipment during sports activities.  If you have firearms in your house, make sure you follow all gun safety procedures.  Seek help if you have been physically or sexually abused.  What's next?  Visit your health care provider once a year for an annual wellness visit.  Ask your health care provider how often you should have your eyes and teeth checked.  Stay up to date on all vaccines.  This information is not intended to replace advice given to you by your health care provider. Make sure you discuss any questions you have with your health care provider.  Document Revised: 05/13/2021 Document Reviewed: 05/13/2021  Elsevier Patient Education  2024 [employer].

## 2024-06-22 NOTE — Progress Notes (Signed)
 Established Patient Office Visit  Subjective   Patient ID: [name], female    DOB: 1971/11/26  Age: 53 y.o. MRN: [id number]  Chief Complaint  Patient presents with   Annual Exam    Pt states fasting     HPI Discussed the use of AI scribe software for clinical note transcription with the patient, who gave verbal consent to proceed.  History of Present Illness [name] is a 53 year old female who presents for medication refills and follow-up on her blood pressure management.  She is currently taking potassium, losartan , hydrochlorothiazide , and Prozac . She had been on amlodipine  for a month but could not return for follow-up due to financial constraints. She requested that her blood pressure be taken on the opposite arm, as the usual arm often shows higher readings. She is concerned about the consistency of these readings.  She is self-paying for her medical expenses and has filled out home health paperwork multiple times without receiving a response. She is recently divorced and does not have insurance, as her ex-husband, who was the primary breadwinner, is currently incarcerated. She is unable to afford 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  She is not currently smoking and is not sexually active. She lives with her best friend, who is also her roommate, and she has been teaching together for over twenty years. She continues to swim and walk regularly for exercise.  She has not had her eyes checked recently and has not visited a dentist in a while. She experiences occasional joint pain in her knees and ankles, especially when it rains. No issues with stomach or other joints.   Family History  Problem Relation Age of Onset   Heart disease Mother    Hypertension Mother    Diabetes Mother    Kidney disease Mother    Hypertension Father    Heart disease Father    Rheum arthritis Father    Cirrhosis Father     Heart disease Paternal Grandmother    Breast cancer Paternal Grandmother    Osteoarthritis Paternal Grandfather    Cancer Paternal Uncle        liver with mets     Review of Systems  Constitutional:  Negative for chills, fever and malaise/fatigue.  HENT:  Negative for congestion and hearing loss.   Eyes:  Negative for blurred vision and discharge.  Respiratory:  Negative for cough, sputum production and shortness of breath.   Cardiovascular:  Negative for chest pain, palpitations and leg swelling.  Gastrointestinal:  Negative for abdominal pain, blood in stool, constipation, diarrhea, heartburn, nausea and vomiting.  Genitourinary:  Negative for dysuria, frequency, hematuria and urgency.  Musculoskeletal:  Negative for back pain, falls and myalgias.  Skin:  Negative for rash.  Neurological:  Negative for dizziness, sensory change, loss of consciousness, weakness and headaches.  Endo/Heme/Allergies:  Negative for environmental allergies. Does not bruise/bleed easily.  Psychiatric/Behavioral:  Negative for depression and suicidal ideas. The patient is not nervous/anxious and does not have insomnia.       Objective:     BP (!) 144/100 (BP Location: Right Arm, Patient Position: Sitting, Cuff Size: Large)   Pulse 70   Temp 98.3 F (36.8 C) (Oral)   Resp 18   Ht 5' (1.524 m)   Wt 182 lb 12.8 oz (82.9 kg)   SpO2 99%   BMI 35.70 kg/m  BP Readings from Last 3 Encounters:  06/22/24 ROLLEN)  144/100  02/10/24 (!) 140/78  06/23/23 (!) 160/100   Wt Readings from Last 3 Encounters:  06/22/24 182 lb 12.8 oz (82.9 kg)  02/10/24 182 lb 9.6 oz (82.8 kg)  06/23/23 198 lb 3.2 oz (89.9 kg)   SpO2 Readings from Last 3 Encounters:  06/22/24 99%  02/10/24 99%  06/23/23 98%      Physical Exam Vitals and nursing note reviewed.  Constitutional:      General: She is not in acute distress.    Appearance: Normal appearance. She is well-developed.  HENT:     Head: Normocephalic and  atraumatic.     Right Ear: Tympanic membrane, ear canal and external ear normal. There is no impacted cerumen.     Left Ear: Tympanic membrane, ear canal and external ear normal. There is no impacted cerumen.     Nose: Nose normal.     Mouth/Throat:     Mouth: Mucous membranes are moist.     Pharynx: Oropharynx is clear. No oropharyngeal exudate or posterior oropharyngeal erythema.  Eyes:     General: No scleral icterus.       Right eye: No discharge.        Left eye: No discharge.     Conjunctiva/sclera: Conjunctivae normal.     Pupils: Pupils are equal, round, and reactive to light.  Neck:     Thyroid : No thyromegaly or thyroid  tenderness.     Vascular: No JVD.  Cardiovascular:     Rate and Rhythm: Normal rate and regular rhythm.     Heart sounds: Normal heart sounds. No murmur heard. Pulmonary:     Effort: Pulmonary effort is normal. No respiratory distress.     Breath sounds: Normal breath sounds.  Abdominal:     General: Bowel sounds are normal. There is no distension.     Palpations: Abdomen is soft. There is no mass.     Tenderness: There is no abdominal tenderness. There is no guarding or rebound.  Musculoskeletal:        General: Normal range of motion.     Cervical back: Normal range of motion and neck supple.     Right lower leg: No edema.     Left lower leg: No edema.  Lymphadenopathy:     Cervical: No cervical adenopathy.  Skin:    General: Skin is warm and dry.     Findings: No erythema or rash.  Neurological:     Mental Status: She is alert and oriented to person, place, and time.     Cranial Nerves: No cranial nerve deficit.     Deep Tendon Reflexes: Reflexes are normal and symmetric.  Psychiatric:        Mood and Affect: Mood normal.        Behavior: Behavior normal.        Thought Content: Thought content normal.        Judgment: Judgment normal.      No results found for any visits on 06/22/24.  Last CBC Lab Results  Component Value Date   WBC  7.3 02/10/2024   HGB 12.6 02/10/2024   HCT 38.4 02/10/2024   MCV 87.3 02/10/2024   MCH 28.6 02/10/2024   RDW 12.7 02/10/2024   PLT 306 02/10/2024   Last metabolic panel Lab Results  Component Value Date   GLUCOSE 97 02/10/2024   NA 142 02/10/2024   K 3.1 (L) 02/10/2024   CL 101 02/10/2024   CO2 32 02/10/2024   BUN 18 02/10/2024  CREATININE 0.76 02/10/2024   GFR 88.43 06/23/2023   CALCIUM 9.3 02/10/2024   PROT 7.0 02/10/2024   ALBUMIN 4.2 06/23/2023   BILITOT 0.4 02/10/2024   ALKPHOS 111 06/23/2023   AST 20 02/10/2024   ALT 17 02/10/2024   ANIONGAP 6 05/22/2022   Last lipids Lab Results  Component Value Date   CHOL 159 02/10/2024   HDL 53 02/10/2024   LDLCALC 76 02/10/2024   TRIG 200 (H) 02/10/2024   CHOLHDL 3.0 02/10/2024   Last hemoglobin A1c Lab Results  Component Value Date   HGBA1C 5.3 05/31/2022   Last thyroid  functions Lab Results  Component Value Date   TSH 1.46 02/10/2024   T4TOTAL 8.6 01/26/2016   Last vitamin D No results found for: 25OHVITD2, 25OHVITD3, VD25OH Last vitamin B12 and Folate Lab Results  Component Value Date   VITAMINB12 615 08/31/2012   FOLATE 9.2 09/29/2010      The ASCVD Risk score (Arnett DK, et al., 2019) failed to calculate for the following reasons:   Unable to determine if patient is Non-Hispanic African American    Assessment & Plan:   Problem List Items Addressed This Visit       Unprioritized   Anxiety   Relevant Medications   ALPRAZolam  (XANAX ) 0.5 MG tablet   FLUoxetine  (PROZAC ) 40 MG capsule   Generalized anxiety disorder   Relevant Medications   ALPRAZolam  (XANAX ) 0.5 MG tablet   FLUoxetine  (PROZAC ) 40 MG capsule   Preventative health care - Primary   Ghm utd Check labs  See AVS Health Maintenance  Topic Date Due   Hepatitis C Screening  Never done   Hepatitis B Vaccines (2 of 3 - 19+ 3-dose series) 08/04/2007   Colonoscopy  Never done   MAMMOGRAM  08/02/2019   Zoster Vaccines- Shingrix  (1 of 2) Never done   HIV Screening  08/01/2024 (Originally [birth year]/1985)   INFLUENZA VACCINE  06/29/2024   DTaP/Tdap/Td (4 - Td or Tdap) 02/16/2033   HPV VACCINES  Aged Out   Meningococcal B Vaccine  Aged Out   COVID-19 Vaccine  Discontinued         Relevant Orders   CBC with Differential/Platelet   Comprehensive metabolic panel with GFR   Lipid panel   TSH   HTN (hypertension)   Poorly controlled will alter medications, encouraged DASH diet, minimize caffeine and obtain adequate sleep. Report concerning symptoms and follow up as directed and as needed  Refill norvasc  Nurse visit in 2-3 weeks       Relevant Medications   amLODipine  (NORVASC ) 5 MG tablet   losartan -hydrochlorothiazide  (HYZAAR) 100-25 MG tablet   potassium chloride  SA (KLOR-CON  M) 20 MEQ tablet   Other Relevant Orders   CBC with Differential/Platelet   Comprehensive metabolic panel with GFR   Lipid panel   TSH  Assessment and Plan Assessment & Plan Hypertension   Blood pressure is elevated. She is on losartan  and hydrochlorothiazide  but not taking amlodipine  due to financial constraints. Refill losartan  and hydrochlorothiazide  prescriptions.  Depression   Currently on Prozac . Refill Prozac  prescription.  General Health Maintenance   Financial constraints affect access to health screenings such as colonoscopy and mammogram. She is uninsured, recently divorced, not smoking, and physically active with swimming and walking. She has not had recent eye or dental exams due to financial constraints. Explore cone payment plan for mammogram to reduce costs. Provide information on affordable dental cleaning services.  Follow-up   No response from home health service after multiple  submissions of paperwork. Print production planner to investigate the lack of response from the home health service.    Return in about 3 weeks (around 07/13/2024), or if symptoms worsen or fail to improve, for nurse visit bp check.    Emila Steinhauser R  Lowne Chase, DO

## 2024-06-22 NOTE — Assessment & Plan Note (Signed)
 Ghm utd Check labs  See AVS Health Maintenance  Topic Date Due   Hepatitis C Screening  Never done   Hepatitis B Vaccines (2 of 3 - 19+ 3-dose series) 08/04/2007   Colonoscopy  Never done   MAMMOGRAM  08/02/2019   Zoster Vaccines- Shingrix (1 of 2) Never done   HIV Screening  08/01/2024 (Originally 08/22/1985)   INFLUENZA VACCINE  06/29/2024   DTaP/Tdap/Td (4 - Td or Tdap) 02/16/2033   HPV VACCINES  Aged Out   Meningococcal B Vaccine  Aged Out   COVID-19 Vaccine  Discontinued

## 2024-06-24 ENCOUNTER — Ambulatory Visit: Payer: Self-pay | Admitting: Family Medicine

## 2024-06-24 DIAGNOSIS — I10 Essential (primary) hypertension: Secondary | ICD-10-CM

## 2024-07-12 ENCOUNTER — Ambulatory Visit: Payer: Self-pay

## 2024-07-17 ENCOUNTER — Ambulatory Visit (INDEPENDENT_AMBULATORY_CARE_PROVIDER_SITE_OTHER): Payer: Self-pay

## 2024-07-17 ENCOUNTER — Telehealth: Payer: Self-pay | Admitting: *Deleted

## 2024-07-17 DIAGNOSIS — I10 Essential (primary) hypertension: Secondary | ICD-10-CM

## 2024-07-17 NOTE — Telephone Encounter (Signed)
 Copied from CRM #[id number]. Topic: General - Other >> Jul 17, 2024  9:23 AM Revonda D wrote: Reason for CRM: Pt is wanting to reschedule nurse visit appt to check BP. Pt stated that since school has started back she can either come in at 7am or after 3:30pm. When trying to reschedule there was no availability for requested times. Pt would like to confirm if she still needs the appt and if so can someone give her a call to see if she can be squeezed in for an early appt.

## 2024-07-17 NOTE — Progress Notes (Signed)
 Pt here for Blood pressure check per Dr.Lowne-Chase, DO  Pt currently takes: Norvasc  5 mg Hyzaar 100-25 mg  Pt reports compliance with medication.  BP today @ =160/80 - left Right-158/84  5 minute wait  Right -148/82 Left - 122/80  HR =76  Pt advised per PCP to follow up in October, appointment scheduled

## 2024-07-17 NOTE — Telephone Encounter (Signed)
 Pt scheduled for today.

## 2024-09-11 ENCOUNTER — Ambulatory Visit: Payer: Self-pay | Admitting: Family Medicine

## 2024-09-11 ENCOUNTER — Encounter: Payer: Self-pay | Admitting: Family Medicine

## 2024-09-11 VITALS — BP 132/78 | HR 73 | Temp 98.4°F | Resp 18 | Ht 60.0 in | Wt 189.6 lb

## 2024-09-11 DIAGNOSIS — F418 Other specified anxiety disorders: Secondary | ICD-10-CM

## 2024-09-11 DIAGNOSIS — I10 Essential (primary) hypertension: Secondary | ICD-10-CM

## 2024-09-11 NOTE — Progress Notes (Addendum)
 Subjective:    Patient ID: [name], female    DOB: May 09, 1971, 53 y.o.   MRN: [id number]  Chief Complaint  Patient presents with   Hypertension   Follow-up    HPI Patient is in today for f/u .  Discussed the use of AI scribe software for clinical note transcription with the patient, who gave verbal consent to proceed.  History of Present Illness [name] is a 53 year old female with hypertension who presents for a blood pressure check.  She is currently taking losartan , Prozac , and amlodipine , all of which were refilled in July. She does not need any refills at this time and is managing well with her current medications.  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  She does not have insurance to cover a colonoscopy and works for a [employer] school, which does not provide insurance benefits.  Her father passed away in Oct 19, 2024 three years ago, and her mother passed away in 12/19/24 two years ago. She lives with a roommate who is her best friend and sister figure. She plans to spend Thanksgiving with her roommate's friend in [place]  and will be alone for Christmas, but she feels supported by her network of friends.  She reports feeling happy and at peace despite recent family losses.    Past Medical History:  Diagnosis Date   Anxiety    H/O endometritis    Hypertension     Past Surgical History:  Procedure Laterality Date   ABDOMINAL HYSTERECTOMY  05/05/2011   TAH---fibroids and endometriosis   CHOLECYSTECTOMY  02/2010    Family History  Problem Relation Age of Onset   Heart disease Mother    Hypertension Mother    Diabetes Mother    Kidney disease Mother    Hypertension Father    Heart disease Father    Rheum arthritis Father    Cirrhosis Father    Heart disease Paternal Grandmother    Breast cancer Paternal Grandmother     Osteoarthritis Paternal Grandfather    Cancer Paternal Uncle        liver with mets     Social History   Socioeconomic History   Marital status: Divorced    Spouse name: Not on file   Number of children: Not on file   Years of education: Not on file   Highest education level: Associate degree: academic program  Occupational History   Occupation: Magazine features editor: [employer]    Comment: [employer]  Tobacco Use   Smoking status: Former    Current packs/day: 0.00    Types: Cigarettes    Quit date: 11/29/1988    Years since quitting: 35.8   Smokeless tobacco: Never  Substance and Sexual Activity   Alcohol use: Yes    Comment: rare wine use   Drug use: No   Sexual activity: Not Currently    Partners: Male  Other Topics Concern   Not on file  Social History Narrative   ** Merged History Encounter **       Exercising -- swimming, walking    Social Drivers of Health   Financial Resource Strain: High Risk (09/10/2024)   Overall Financial Resource Strain (CARDIA)    Difficulty of Paying Living Expenses: Hard  Food Insecurity: Food Insecurity Present (09/10/2024)   Hunger Vital Sign    Worried About Running Out of  Food in the Last Year: Sometimes true    Ran Out of Food in the Last Year: Never true  Transportation Needs: No Transportation Needs (09/10/2024)   PRAPARE - Administrator, Civil Service (Medical): No    Lack of Transportation (Non-Medical): No  Physical Activity: Insufficiently Active (09/10/2024)   Exercise Vital Sign    Days of Exercise per Week: 3 days    Minutes of Exercise per Session: 20 min  Stress: Stress Concern Present (09/10/2024)   [employer] of Occupational Health - Occupational Stress Questionnaire    Feeling of Stress: To some extent  Social Connections: Moderately Isolated (09/10/2024)   Social Connection and Isolation Panel    Frequency of Communication with Friends and Family: More than three times a week     Frequency of Social Gatherings with Friends and Family: More than three times a week    Attends Religious Services: More than 4 times per year    Active Member of [employer] or Organizations: No    Attends Banker Meetings: Not on file    Marital Status: Divorced  Intimate Partner Violence: Unknown (03/02/2022)   Received from [facility]   HITS    Physically Hurt: Not on file    Insult or Talk Down To: Not on file    Threaten Physical Harm: Not on file    Scream or Curse: Not on file    Outpatient Medications Prior to Visit  Medication Sig Dispense Refill   ALPRAZolam  (XANAX ) 0.5 MG tablet Take 1 tablet (0.5 mg total) by mouth 3 (three) times daily as needed. 60 tablet 1   amLODipine  (NORVASC ) 5 MG tablet Take 1 tablet (5 mg total) by mouth daily. 90 tablet 1   FLUoxetine  (PROZAC ) 40 MG capsule Take 1 capsule (40 mg total) by mouth daily. 90 capsule 3   losartan -hydrochlorothiazide  (HYZAAR) 100-25 MG tablet Take 1 tablet by mouth daily. 90 tablet 1   potassium chloride  SA (KLOR-CON  M) 20 MEQ tablet Take 2 tablets (40 mEq total) by mouth daily.     No facility-administered medications prior to visit.    Allergies  Allergen Reactions   Metoclopramide  Anxiety    Review of Systems  Constitutional:  Negative for fever and malaise/fatigue.  HENT:  Negative for congestion.   Eyes:  Negative for blurred vision.  Respiratory:  Negative for cough and shortness of breath.   Cardiovascular:  Negative for chest pain, palpitations and leg swelling.  Gastrointestinal:  Negative for vomiting.  Musculoskeletal:  Negative for back pain.  Skin:  Negative for rash.  Neurological:  Negative for loss of consciousness and headaches.       Objective:    Physical Exam Vitals and nursing note reviewed.  Constitutional:      General: She is not in acute distress.    Appearance: Normal appearance. She is well-developed.  HENT:     Head: Normocephalic and atraumatic.  Eyes:      General: No scleral icterus.       Right eye: No discharge.        Left eye: No discharge.  Cardiovascular:     Rate and Rhythm: Normal rate and regular rhythm.     Heart sounds: No murmur heard. Pulmonary:     Effort: Pulmonary effort is normal. No respiratory distress.     Breath sounds: Normal breath sounds.  Musculoskeletal:        General: Normal range of motion.     Cervical back:  Normal range of motion and neck supple.     Right lower leg: No edema.     Left lower leg: No edema.  Skin:    General: Skin is warm and dry.  Neurological:     Mental Status: She is alert and oriented to person, place, and time.  Psychiatric:        Mood and Affect: Mood normal.        Behavior: Behavior normal.        Thought Content: Thought content normal.        Judgment: Judgment normal.     BP 132/78 (BP Location: Right Arm, Patient Position: Sitting)   Pulse 73   Temp 98.4 F (36.9 C) (Oral)   Resp 18   Ht 5' (1.524 m)   Wt 189 lb 9.6 oz (86 kg)   SpO2 99%   BMI 37.03 kg/m  Wt Readings from Last 3 Encounters:  09/11/24 189 lb 9.6 oz (86 kg)  06/22/24 182 lb 12.8 oz (82.9 kg)  02/10/24 182 lb 9.6 oz (82.8 kg)    Diabetic Foot Exam - Simple   No data filed    Lab Results  Component Value Date   WBC 5.8 06/22/2024   HGB 12.8 06/22/2024   HCT 37.5 06/22/2024   PLT 270.0 06/22/2024   GLUCOSE 101 (H) 06/22/2024   CHOL 162 06/22/2024   TRIG 69.0 06/22/2024   HDL 61.20 06/22/2024   LDLCALC 87 06/22/2024   ALT 18 06/22/2024   AST 20 06/22/2024   NA 143 06/22/2024   K 3.2 (L) 06/22/2024   CL 102 06/22/2024   CREATININE 0.78 06/22/2024   BUN 14 06/22/2024   CO2 33 (H) 06/22/2024   TSH 1.72 06/22/2024   HGBA1C 5.3 05/31/2022    Lab Results  Component Value Date   TSH 1.72 06/22/2024   Lab Results  Component Value Date   WBC 5.8 06/22/2024   HGB 12.8 06/22/2024   HCT 37.5 06/22/2024   MCV 83.3 06/22/2024   PLT 270.0 06/22/2024   Lab Results  Component Value  Date   NA 143 06/22/2024   K 3.2 (L) 06/22/2024   CO2 33 (H) 06/22/2024   GLUCOSE 101 (H) 06/22/2024   BUN 14 06/22/2024   CREATININE 0.78 06/22/2024   BILITOT 0.7 06/22/2024   ALKPHOS 84 06/22/2024   AST 20 06/22/2024   ALT 18 06/22/2024   PROT 6.9 06/22/2024   ALBUMIN 4.1 06/22/2024   CALCIUM 9.3 06/22/2024   ANIONGAP 6 05/22/2022   GFR 86.47 06/22/2024   Lab Results  Component Value Date   CHOL 162 06/22/2024   Lab Results  Component Value Date   HDL 61.20 06/22/2024   Lab Results  Component Value Date   LDLCALC 87 06/22/2024   Lab Results  Component Value Date   TRIG 69.0 06/22/2024   Lab Results  Component Value Date   CHOLHDL 3 06/22/2024   Lab Results  Component Value Date   HGBA1C 5.3 05/31/2022       Assessment & Plan:  Depression with anxiety Assessment & Plan: Stable    Primary hypertension  Assessment and Plan Assessment & Plan Hypertension   Hypertension is managed with losartan  and amlodipine . Recent labs were completed in July. Continue losartan  and amlodipine . Schedule follow-up in February for further evaluation.  Depression   Depression is well-managed with no current symptoms. Continue Prozac .  General Health Maintenance   She has not had a colonoscopy due to lack  of insurance coverage. She has not received a flu shot.    Kwesi Sangha R Lowne Chase, DO

## 2024-09-17 DIAGNOSIS — F418 Other specified anxiety disorders: Secondary | ICD-10-CM | POA: Insufficient documentation

## 2024-09-17 NOTE — Assessment & Plan Note (Signed)
 Stable

## 2024-12-08 ENCOUNTER — Other Ambulatory Visit: Payer: Self-pay | Admitting: Family Medicine

## 2024-12-08 DIAGNOSIS — I10 Essential (primary) hypertension: Secondary | ICD-10-CM

## 2025-01-22 ENCOUNTER — Ambulatory Visit: Payer: Self-pay | Admitting: Family Medicine
